# Patient Record
Sex: Male | Born: 1955 | Race: White | Hispanic: No | Marital: Married | Smoking: Never smoker
Health system: Southern US, Community
[De-identification: ages and names within clinical notes are randomized; demographics above are authoritative.]

## PROBLEM LIST (undated history)

## (undated) DIAGNOSIS — K219 Gastro-esophageal reflux disease without esophagitis: Secondary | ICD-10-CM

## (undated) DIAGNOSIS — E785 Hyperlipidemia, unspecified: Secondary | ICD-10-CM

## (undated) HISTORY — DX: Gastro-esophageal reflux disease without esophagitis: K21.9

## (undated) HISTORY — DX: Hyperlipidemia, unspecified: E78.5

## (undated) HISTORY — PX: APPENDECTOMY: SHX54

## (undated) HISTORY — PX: OTHER SURGICAL HISTORY: SHX169

---

## 2016-05-19 LAB — HM COLONOSCOPY

## 2016-05-24 DIAGNOSIS — Z8249 Family history of ischemic heart disease and other diseases of the circulatory system: Secondary | ICD-10-CM | POA: Insufficient documentation

## 2016-05-24 DIAGNOSIS — E785 Hyperlipidemia, unspecified: Secondary | ICD-10-CM | POA: Insufficient documentation

## 2018-05-21 ENCOUNTER — Ambulatory Visit: Payer: Managed Care, Other (non HMO) | Admitting: Family Medicine

## 2018-05-21 ENCOUNTER — Encounter (INDEPENDENT_AMBULATORY_CARE_PROVIDER_SITE_OTHER): Payer: Self-pay

## 2018-05-21 ENCOUNTER — Encounter: Payer: Self-pay | Admitting: Family Medicine

## 2018-05-21 VITALS — BP 100/72 | HR 70 | Temp 98.1°F | Ht 66.0 in | Wt 162.0 lb

## 2018-05-21 DIAGNOSIS — J302 Other seasonal allergic rhinitis: Secondary | ICD-10-CM

## 2018-05-21 DIAGNOSIS — G8929 Other chronic pain: Secondary | ICD-10-CM | POA: Diagnosis not present

## 2018-05-21 DIAGNOSIS — M25512 Pain in left shoulder: Secondary | ICD-10-CM

## 2018-05-21 DIAGNOSIS — K219 Gastro-esophageal reflux disease without esophagitis: Secondary | ICD-10-CM | POA: Insufficient documentation

## 2018-05-21 DIAGNOSIS — E785 Hyperlipidemia, unspecified: Secondary | ICD-10-CM | POA: Insufficient documentation

## 2018-05-21 DIAGNOSIS — Z7689 Persons encountering health services in other specified circumstances: Secondary | ICD-10-CM | POA: Diagnosis not present

## 2018-05-21 DIAGNOSIS — M25511 Pain in right shoulder: Secondary | ICD-10-CM

## 2018-05-21 DIAGNOSIS — R12 Heartburn: Secondary | ICD-10-CM | POA: Diagnosis not present

## 2018-05-21 DIAGNOSIS — E782 Mixed hyperlipidemia: Secondary | ICD-10-CM

## 2018-05-21 NOTE — Patient Instructions (Addendum)
Dyslipidemia Dyslipidemia is an imbalance of waxy, fat-like substances (lipids) in the blood. The body needs lipids in small amounts. Dyslipidemia often involves a high level of cholesterol or triglycerides, which are types of lipids. Common forms of dyslipidemia include:  High levels of bad cholesterol (LDL cholesterol). LDL is the type of cholesterol that causes fatty deposits (plaques) to build up in the blood vessels that carry blood away from your heart (arteries).  Low levels of good cholesterol (HDL cholesterol). HDL cholesterol is the type of cholesterol that protects against heart disease. High levels of HDL remove the LDL buildup from arteries.  High levels of triglycerides. Triglycerides are a fatty substance in the blood that is linked to a buildup of plaques in the arteries.  You can develop dyslipidemia because of the genes you are born with (primary dyslipidemia) or changes that occur during your life (secondary dyslipidemia), or as a side effect of certain medical treatments. What are the causes? Primary dyslipidemia is caused by changes (mutations) in genes that are passed down through families (inherited). These mutations cause several types of dyslipidemia. Mutations can result in disorders that make the body produce too much LDL cholesterol or triglycerides, or not enough HDL cholesterol. These disorders may lead to heart disease, arterial disease, or stroke at an early age. Causes of secondary dyslipidemia include certain lifestyle choices and diseases that lead to dyslipidemia, such as:  Eating a diet that is high in animal fat.  Not getting enough activity or exercise (having a sedentary lifestyle).  Having diabetes, kidney disease, liver disease, or thyroid disease.  Drinking large amounts of alcohol.  Using certain types of drugs.  What increases the risk? You may be at greater risk for dyslipidemia if you are an older man or if you are a woman who has gone through  menopause. Other risk factors include:  Having a family history of dyslipidemia.  Taking certain medicines, including birth control pills, steroids, some diuretics, beta-blockers, and some medicines forHIV.  Smoking cigarettes.  Eating a high-fat diet.  Drinking large amounts of alcohol.  Having certain medical conditions such as diabetes, polycystic ovary syndrome (PCOS), pregnancy, kidney disease, liver disease, or hypothyroidism.  Not exercising regularly.  Being overweight or obese with too much belly fat.  What are the signs or symptoms? Dyslipidemia does not usually cause any symptoms. Very high lipid levels can cause fatty bumps under the skin (xanthomas) or a white or gray ring around the black center (pupil) of the eye. Very high triglyceride levels can cause inflammation of the pancreas (pancreatitis). How is this diagnosed? Your health care provider may diagnose dyslipidemia based on a routine blood test (fasting blood test). Because most people do not have symptoms of the condition, this blood testing (lipid profile) is done on adults age 40 and older and is repeated every 5 years. This test checks:  Total cholesterol. This is a measure of the total amount of cholesterol in your blood, including LDL cholesterol, HDL cholesterol, and triglycerides. A healthy number is below 200.  LDL cholesterol. The target number for LDL cholesterol is different for each person, depending on individual risk factors. For most people, a number below 100 is healthy. Ask your health care provider what your LDL cholesterol number should be.  HDL cholesterol. An HDL level of 60 or higher is best because it helps to protect against heart disease. A number below 42 for men or below 39 for women increases the risk for heart disease.  Triglycerides. A  healthy triglyceride number is below 150.  If your lipid profile is abnormal, your health care provider may do other blood tests to get more  information about your condition. How is this treated? Treatment depends on the type of dyslipidemia that you have and your other risk factors for heart disease and stroke. Your health care provider will have a target range for your lipid levels based on this information. For many people, treatment starts with lifestyle changes, such as diet and exercise. Your health care provider may recommend that you:  Get regular exercise.  Make changes to your diet.  Quit smoking if you smoke.  If diet changes and exercise do not help you reach your goals, your health care provider may also prescribe medicine to lower lipids. The most commonly prescribed type of medicine lowers your LDL cholesterol (statin drug). If you have a high triglyceride level, your provider may prescribe another type of drug (fibrate) or an omega-3 fish oil supplement, or both. Follow these instructions at home:  Take over-the-counter and prescription medicines only as told by your health care provider. This includes supplements.  Get regular exercise. Start an aerobic exercise and strength training program as told by your health care provider. Ask your health care provider what activities are safe for you. Your health care provider may recommend: ? 30 minutes of aerobic activity 4-6 days a week. Brisk walking is an example of aerobic activity. ? Strength training 2 days a week.  Eat a healthy diet as told by your health care provider. This can help you reach and maintain a healthy weight, lower your LDL cholesterol, and raise your HDL cholesterol. It may help to work with a diet and nutrition specialist (dietitian) to make a plan that is right for you. Your dietitian or health care provider may recommend: ? Limiting your calories, if you are overweight. ? Eating more fruits, vegetables, whole grains, fish, and lean meats. ? Limiting saturated fat, trans fat, and cholesterol.  Follow instructions from your health care provider  or dietitian about eating or drinking restrictions.  Limit alcohol intake to no more than one drink per day for nonpregnant women and two drinks per day for men. One drink equals 12 oz of beer, 5 oz of wine, or 1 oz of hard liquor.  Do not use any products that contain nicotine or tobacco, such as cigarettes and e-cigarettes. If you need help quitting, ask your health care provider.  Keep all follow-up visits as told by your health care provider. This is important. Contact a health care provider if:  You are having trouble sticking to your exercise or diet plan.  You are struggling to quit smoking or control your use of alcohol. Summary  Dyslipidemia is an imbalance of waxy, fat-like substances (lipids) in the blood. The body needs lipids in small amounts. Dyslipidemia often involves a high level of cholesterol or triglycerides, which are types of lipids.  Treatment depends on the type of dyslipidemia that you have and your other risk factors for heart disease and stroke.  For many people, treatment starts with lifestyle changes, such as diet and exercise. Your health care provider may also prescribe medicine to lower lipids. This information is not intended to replace advice given to you by your health care provider. Make sure you discuss any questions you have with your health care provider. Document Released: 11/19/2013 Document Revised: 07/11/2016 Document Reviewed: 07/11/2016 Elsevier Interactive Patient Education  2018 [employer].  Shoulder Pain Many things can  cause shoulder pain, including:  An injury to the area.  Overuse of the shoulder.  Arthritis.  The source of the pain can be:  Inflammation.  An injury to the shoulder joint.  An injury to a tendon, ligament, or bone.  Follow these instructions at home: Take these actions to help with your pain:  Squeeze a soft ball or a foam pad as much as possible. This helps to keep the shoulder from swelling. It also  helps to strengthen the arm.  Take over-the-counter and prescription medicines only as told by your health care provider.  If directed, apply ice to the area: ? Put ice in a plastic bag. ? Place a towel between your skin and the bag. ? Leave the ice on for 20 minutes, 2-3 times per day. Stop applying ice if it does not help with the pain.  If you were given a shoulder sling or immobilizer: ? Wear it as told. ? Remove it to shower or bathe. ? Move your arm as little as possible, but keep your hand moving to prevent swelling.  Contact a health care provider if:  Your pain gets worse.  Your pain is not relieved with medicines.  New pain develops in your arm, hand, or fingers. Get help right away if:  Your arm, hand, or fingers: ? Tingle. ? Become numb. ? Become swollen. ? Become painful. ? Turn white or blue. This information is not intended to replace advice given to you by your health care provider. Make sure you discuss any questions you have with your health care provider. Document Released: 08/24/2005 Document Revised: 07/10/2016 Document Reviewed: 03/09/2015 Elsevier Interactive Patient Education  [employer].

## 2018-05-21 NOTE — Progress Notes (Signed)
Patient presents to clinic today to establish care.  SUBJECTIVE: PMH: Pt is a 62 yo male with pmh sig for GERD, allergies, palpitations, HLD.  Patient was previously seen in [place].  HLD: -dx'd 10 yrs ago -Trying to eat better -Currently taking simvastatin 20 mg daily -Last lipid panel September 2018.  Allergies: -spring and fall -had allergy shots in the past -using nasacort and allegra  Heartburn: -In the past had more issues and currently -Elevated head of bed -Now using Tums occasionally -Tries to avoid spicy foods and other foods that would cause symptoms  Chronic pain of both shoulders: -Pt endorses a history of sleeping on his sides.  This caused shoulder pain. -Pt had x-ray done of left shoulder which was negative. -pt requesting referral to PT.  Allergies: NKDA  Past surgical history: Removal of sinus polyps 1991 Appendectomy 1974 Tonsillectomy 1966 Cataracts removal 2013 in 2015  Social history: Patient is married.  Patient is currently retired from working in Engineer, mining as a Engineer, maintenance (IT).  Patient recently relocated [place] the [place] to be closer to his grandchildren.  Patient's son is a Software engineer for counseling.  Patient endorses social alcohol use.  Patient denies tobacco or drug use.  Family medical history: Mom-deceased at 41, HLD, HTN, stroke Dad-deceased t 74, non-Hodgkin's lymphoma, diabetes, MI, HLD, heart disease Brother-Dave, alive, diabetes, MI, heart disease, HLD Brother-Lee, alive, non-Hodgkin's lymphoma, HLD, HTN Daughter-alive, depression Son-alive MGM-deceased at 49 MGF-deceased at 20, Alzheimer's dementia PGM-Deceased, MI at 25 PGF-deceased, stroke at 79    Health Maintenance: Vision --2019 Immunizations --influenza vaccine 2018, tetanus vaccine 2006 Colonoscopy --2017.  Had a tubular adenoma removed  Past Medical History:  Diagnosis Date  . GERD (gastroesophageal reflux disease)   . Hyperlipidemia     Past  Surgical History:  Procedure Laterality Date  . APPENDECTOMY    . CATARACT    . SINUS POLLYPS    . TONSILLOCTOMY      Current Outpatient Medications on File Prior to Visit  Medication Sig Dispense Refill  . BABY ASPIRIN PO Take 160 mg by mouth daily.    . Coenzyme Q10-Fish Oil-Vit E (CO-Q 10 OMEGA-3 FISH OIL PO) Take 2,000 Units by mouth daily.    . simvastatin (ZOCOR) 20 MG tablet Take 20 mg by mouth daily.    . Triamcinolone Acetonide (NASACORT ALLERGY 24HR NA) Place into the nose as needed.     No current facility-administered medications on file prior to visit.     Allergies not on file  History reviewed. No pertinent family history.  Social History   Socioeconomic History  . Marital status: Married    Spouse name: Not on file  . Number of children: Not on file  . Years of education: Not on file  . Highest education level: Not on file  Occupational History  . Not on file  Social Needs  . Financial resource strain: Not on file  . Food insecurity:    Worry: Not on file    Inability: Not on file  . Transportation needs:    Medical: Not on file    Non-medical: Not on file  Tobacco Use  . Smoking status: Never Smoker  . Smokeless tobacco: Never Used  Substance and Sexual Activity  . Alcohol use: Yes    Comment: OCCASIONAL  . Drug use: Never  . Sexual activity: Not on file  Lifestyle  . Physical activity:    Days per week: Not on file  Minutes per session: Not on file  . Stress: Not on file  Relationships  . Social connections:    Talks on phone: Not on file    Gets together: Not on file    Attends religious service: Not on file    Active member of club or organization: Not on file    Attends meetings of clubs or organizations: Not on file    Relationship status: Not on file  . Intimate partner violence:    Fear of current or ex partner: Not on file    Emotionally abused: Not on file    Physically abused: Not on file    Forced sexual activity: Not on  file  Other Topics Concern  . Not on file  Social History Narrative  . Not on file    ROS General: Denies fever, chills, night sweats, changes in weight, changes in appetite HEENT: Denies headaches, ear pain, changes in vision, rhinorrhea, sore throat CV: Denies CP, palpitations, SOB, orthopnea Pulm: Denies SOB, cough, wheezing GI: Denies abdominal pain, nausea, vomiting, diarrhea, constipation GU: Denies dysuria, hematuria, frequency, vaginal discharge Msk: Denies muscle cramps  +b/l shoulder pain Neuro: Denies weakness, numbness, tingling Skin: Denies rashes, bruising Psych: Denies depression, anxiety, hallucinations  BP 100/72 (BP Location: Left Arm, Patient Position: Sitting, Cuff Size: Normal)   Pulse 70   Temp 98.1 F (36.7 C) (Oral)   Ht 5\' 6"  (1.676 m)   Wt 162 lb (73.5 kg)   SpO2 97%   BMI 26.15 kg/m   Physical Exam Gen. Pleasant, well developed, well-nourished, in NAD HEENT - [place]/AT, PERRL, no scleral icterus, no nasal drainage, pharynx without erythema or exudate. Lungs: no use of accessory muscles, CTAB, no wheezes, rales or rhonchi Cardiovascular: RRR, No r/g/m, no peripheral edema Abdomen: BS present, soft, nontender, nondistended Musculoskeletal: No deformities, moves all four extremities, no cyanosis or clubbing, normal tone. FROM with pain when raising arms above head. Neuro:  A&Ox3, CN II-XII intact, normal gait Skin:  Warm, dry, intact, no lesions   No results found for this or any previous visit (from the past 2160 hour(s)).  Assessment/Plan: Mixed hyperlipidemia -Continue simvastatin 20 mg daily -Continue to make changes to diet including decrease the amount of saturated fats and increase physical activity  Seasonal allergies  - Plan: Ambulatory referral to Allergy  Heart burn -continue to  avoid foods known to cause symptoms -Continue Tums as needed  Encounter to establish care -We reviewed the PMH, PSH, FH, SH, Meds and Allergies. -We  provided refills for any medications we will prescribe as needed. -We addressed current concerns per orders and patient instructions. -We have asked for records for pertinent exams, studies, vaccines and notes from previous providers. -We have advised patient to follow up per instructions below.  Chronic pain of both shoulders  - Plan: Ambulatory referral to Physical Therapy  F/u in Sept for CPE, sooner if needed.  Grier Mitts, MD

## 2018-05-24 ENCOUNTER — Ambulatory Visit: Payer: Managed Care, Other (non HMO) | Attending: Family Medicine | Admitting: Physical Therapy

## 2018-05-24 ENCOUNTER — Other Ambulatory Visit: Payer: Self-pay

## 2018-05-24 DIAGNOSIS — M25612 Stiffness of left shoulder, not elsewhere classified: Secondary | ICD-10-CM

## 2018-05-24 DIAGNOSIS — M6281 Muscle weakness (generalized): Secondary | ICD-10-CM | POA: Insufficient documentation

## 2018-05-24 DIAGNOSIS — M25511 Pain in right shoulder: Secondary | ICD-10-CM | POA: Diagnosis present

## 2018-05-24 DIAGNOSIS — M25512 Pain in left shoulder: Secondary | ICD-10-CM | POA: Insufficient documentation

## 2018-05-24 DIAGNOSIS — M25611 Stiffness of right shoulder, not elsewhere classified: Secondary | ICD-10-CM

## 2018-05-24 DIAGNOSIS — G8929 Other chronic pain: Secondary | ICD-10-CM | POA: Diagnosis present

## 2018-05-24 NOTE — Patient Instructions (Signed)
Access Code: [email or web address]  URL: [email or web address]  Date: 05/24/2018  Prepared by: Lovett Calender   Exercises  Doorway Pec Stretch at 90 Degrees Abduction - 2 reps - 1 sets - 30 sec hold - 3x daily - 7x weekly  Standing Scapular Retraction - 10 reps - 1 sets - 5 sec hold - 3x daily - 7x weekly

## 2018-05-24 NOTE — Therapy (Signed)
[facility] [address], [place], [place], [address] Phone: [phone]   Fax:  [phone]  Physical Therapy Evaluation  Patient Details  Name: Franklin [name] MRN: [id number] Date of Birth: Mar 29, 1956 Referring Provider: Billie Ruddy, MD   Encounter Date: 05/24/2018  PT End of Session - 05/24/18 1007    Visit Number  1    Date for PT Re-Evaluation  07/05/18    PT Start Time  0930    PT Stop Time  1007    PT Time Calculation (min)  37 min    Activity Tolerance  Patient tolerated treatment well    Behavior During Therapy  [facility] for tasks assessed/performed       Past Medical History:  Diagnosis Date  . GERD (gastroesophageal reflux disease)   . Hyperlipidemia     Past Surgical History:  Procedure Laterality Date  . APPENDECTOMY    . CATARACT    . SINUS POLLYPS    . TONSILLOCTOMY      There were no vitals filed for this visit.   Subjective Assessment - 05/24/18 0930    Subjective  Pt presents to clinic due to shoulder pain with overhead reaching.  Sleeping on his side increases pain.  Pt played a lot of golf and tennis.  Began about a year ago    Diagnostic tests  x-ray    Patient Stated Goals  playing golf and tennis    Currently in Pain?  Yes    Pain Score  5     Pain Location  Shoulder    Pain Orientation  Right;Left    Pain Descriptors / Indicators  Sharp    Pain Type  Chronic pain    Pain Onset  More than a month ago    Pain Frequency  Intermittent    Aggravating Factors   overhead reaching and side sleeping, some pain with golf    Pain Relieving Factors  getting into a comfortable position    Effect of Pain on Daily Activities  lifting grandson         [facility] PT Assessment - 05/24/18 0001      Assessment   Medical Diagnosis  M25.511,G89.29,M25.512 (ICD-10-CM) - Chronic pain of both shoulders    Referring Provider  Billie Ruddy, MD    Onset Date/Surgical Date  -- last summer    Hand Dominance  Right    Prior Therapy  No      Precautions   Precautions  None      Restrictions   Weight Bearing Restrictions  No      Balance Screen   Has the patient fallen in the past 6 months  No      [place] residence    Living Arrangements  Spouse/significant other      Prior Function   Level of [place]  Retired    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    Leisure  golf and tennis      Cognition   Overall Cognitive Status  Within Functional Limits for tasks assessed      Observation/Other Assessments   Focus on Therapeutic Outcomes (FOTO)   32% limited      Posture/Postural Control   Posture/Postural Control  Postural limitations    Postural Limitations  Rounded Shoulders;Forward head;Increased thoracic kyphosis      ROM / Strength   AROM / PROM /  Strength  AROM;Strength      AROM   AROM Assessment Site  Shoulder    Right/Left Shoulder  Right;Left    Right Shoulder Flexion  142 Degrees    Right Shoulder ABduction  155 Degrees    Left Shoulder Flexion  156 Degrees    Left Shoulder ABduction  134 Degrees      Strength   Strength Assessment Site  Shoulder    Right/Left Shoulder  Right;Left    Right Shoulder Flexion  4+/5    Right Shoulder Extension  5/5    Right Shoulder ABduction  4+/5    Right Shoulder Internal Rotation  5/5    Right Shoulder External Rotation  4/5    Right Shoulder Horizontal ABduction  5/5    Right Shoulder Horizontal ADduction  5/5    Left Shoulder Flexion  4/5    Left Shoulder Extension  5/5    Left Shoulder ABduction  4/5    Left Shoulder Internal Rotation  5/5    Left Shoulder External Rotation  4/5    Left Shoulder Horizontal ABduction  4/5    Left Shoulder Horizontal ADduction  4+/5      Palpation   Palpation comment  bilateral humerus in anterior position within glenohumeral joint and hypomobility, pec major/minor, anterior deltoid tight  and tender; tender anterior head of humerus      Special Tests    Special Tests  Rotator Cuff Impingement    Rotator Cuff Impingment tests  Neer impingement test;Hawkins- Kennedy test;Empty Can test      Neer Impingement test    Findings  Positive    Side  Right      Hawkins-Kennedy test   Findings  Negative      Empty Can test   Findings  Positive    Side  Right      Ambulation/Gait   Gait Pattern  Within Functional Limits                Objective measurements completed on examination: See above findings.              PT Education - 05/24/18 1044    Education Details   Access Code: [email or web address]           PT Long Term Goals - 05/24/18 1119      PT LONG TERM GOAL #1   Title  pt will demonstrate shoulder flexion of at least 160 degrees bilaterally for improved functional overhead reaching    Time  6    Period  Weeks    Status  New    Target Date  07/05/18      PT LONG TERM GOAL #2   Title  pt able to reach to back seat of the car without pain    Time  6    Period  Weeks    Status  New    Target Date  07/05/18      PT LONG TERM GOAL #3   Title  ind with advanced HEP    Time  6    Period  Weeks    Status  New    Target Date  07/05/18      PT LONG TERM GOAL #4   Title  FOTO < or = to 24% limited    Time  6    Period  Weeks    Status  New    Target Date  07/05/18      PT LONG TERM  GOAL #5   Title  pt will demonstrate 5/5 MMT external rotation of bilateral shoulder for improved functional lifting and reaching    Time  6    Period  Weeks    Status  New    Target Date  07/05/18             Plan - 05/24/18 1044    Clinical Impression Statement  Pt presents to skilled PT due to shoulder pain that has been getting worse over the last year.  Pain began in left shoulder and now in both mostly with overhead and reaching back.  Pt has positive Neer and empty can on the left side.  Pt demonstrates postural deficits.  He has bilateral  shoulder weakness. Pt has hypomobilty and anteriorly positioned humeral head bilaterally.  Pt has tight and tender pecs and anterior deltoid.  There is some tenderness of RTC attatchments with palpation. Pt will benefit from skilled PT to address impairments and return to full functional activities.    Clinical Presentation  Evolving    Clinical Presentation due to:  pt has had increased pain recently    Clinical Decision Making  Low    Rehab Potential  Excellent    PT Frequency  2x / week    PT Duration  6 weeks    PT Treatment/Interventions  ADLs/Self Care Home Management;Passive range of motion;Neuromuscular re-education;Biofeedback;Cryotherapy;Electrical Stimulation;Iontophoresis 4mg /ml Dexamethasone;Moist Heat;Ultrasound;Therapeutic activities;Therapeutic exercise;Patient/family education;Manual techniques;Taping;Dry needling    PT Next Visit Plan  A/P joint mobs and STM to pecs and deltoid, shoulder ROM, postural and scapular strength and stability    PT Home Exercise Plan   Access Code: [email or web address]     Recommended Other Services  eval 05/24/18    Consulted and Agree with Plan of Care  Patient       Patient will benefit from skilled therapeutic intervention in order to improve the following deficits and impairments:  Pain, Hypomobility, Decreased strength, Decreased range of motion, Impaired UE functional use  Visit Diagnosis: Chronic left shoulder pain - Plan: PT plan of care cert/re-cert  Chronic right shoulder pain - Plan: PT plan of care cert/re-cert  Muscle weakness (generalized) - Plan: PT plan of care cert/re-cert  Stiffness of left shoulder, not elsewhere classified - Plan: PT plan of care cert/re-cert  Stiffness of right shoulder, not elsewhere classified - Plan: PT plan of care cert/re-cert     Problem List Patient Active Problem List   Diagnosis Date Noted  . Chronic pain of both shoulders 05/21/2018  . Heart burn 05/21/2018  . Seasonal allergies 05/21/2018  . Mixed  hyperlipidemia 05/21/2018    Zannie Cove, PT 05/24/2018, 6:02 PM  [facility] [address] [place], [place], [address] Phone: [phone]   Fax:  [phone]  Name: Franklin [name] MRN: [id number] Date of Birth: 1956-11-24

## 2018-05-29 ENCOUNTER — Encounter: Payer: Self-pay | Admitting: Physical Therapy

## 2018-05-29 ENCOUNTER — Ambulatory Visit: Payer: Managed Care, Other (non HMO) | Attending: Family Medicine | Admitting: Physical Therapy

## 2018-05-29 DIAGNOSIS — G8929 Other chronic pain: Secondary | ICD-10-CM | POA: Insufficient documentation

## 2018-05-29 DIAGNOSIS — M6281 Muscle weakness (generalized): Secondary | ICD-10-CM | POA: Insufficient documentation

## 2018-05-29 DIAGNOSIS — M25511 Pain in right shoulder: Secondary | ICD-10-CM | POA: Insufficient documentation

## 2018-05-29 DIAGNOSIS — M25512 Pain in left shoulder: Secondary | ICD-10-CM | POA: Diagnosis present

## 2018-05-29 DIAGNOSIS — M25612 Stiffness of left shoulder, not elsewhere classified: Secondary | ICD-10-CM | POA: Diagnosis present

## 2018-05-29 DIAGNOSIS — M25611 Stiffness of right shoulder, not elsewhere classified: Secondary | ICD-10-CM | POA: Insufficient documentation

## 2018-05-29 NOTE — Therapy (Signed)
[facility] [address] [place], [place], [address] Phone: [phone]   Fax:  [phone]  Physical Therapy Treatment  Patient Details  Name: Franklin [name] MRN: [id number] Date of Birth: 1956-11-06 Referring Provider: Billie Ruddy, MD   Encounter Date: 05/29/2018  PT End of Session - 05/29/18 0800    Visit Number  2    Date for PT Re-Evaluation  07/05/18    PT Start Time  0800    PT Stop Time  0840    PT Time Calculation (min)  40 min    Activity Tolerance  Patient tolerated treatment well    Behavior During Therapy  [facility] for tasks assessed/performed       Past Medical History:  Diagnosis Date  . GERD (gastroesophageal reflux disease)   . Hyperlipidemia     Past Surgical History:  Procedure Laterality Date  . APPENDECTOMY    . CATARACT    . SINUS POLLYPS    . TONSILLOCTOMY      There were no vitals filed for this visit.  Subjective Assessment - 05/29/18 0800    Subjective  Pt denies pain this morning.  Reports it is improving overall.  Still having pain when reaching back.    Currently in Pain?  No/denies                       [facility] Adult PT Treatment/Exercise - 05/29/18 0001      Exercises   Exercises  Shoulder      Shoulder Exercises: Standing   Horizontal ABduction  Strengthening;Both;Theraband 30 reps    Theraband Level (Shoulder Horizontal ABduction)  Level 1 (Yellow)    External Rotation  Strengthening;Both;20 reps;Theraband    Theraband Level (Shoulder External Rotation)  Level 2 (Red)    Internal Rotation  Strengthening;Both;20 reps;Theraband    Theraband Level (Shoulder Internal Rotation)  Level 2 (Red)    Extension  Strengthening;Both;20 reps;Theraband    Theraband Level (Shoulder Extension)  Level 2 (Red)    Row  Strengthening;Both;20 reps;Theraband    Theraband Level (Shoulder Row)  Level 2 (Red);Level 3 (Green)      Shoulder Exercises: Pulleys   Flexion  3  minutes PT present to discuss progress      Shoulder Exercises: ROM/Strengthening   Other ROM/Strengthening Exercises  flexion with ball on the wall                  PT Long Term Goals - 05/24/18 1119      PT LONG TERM GOAL #1   Title  pt will demonstrate shoulder flexion of at least 160 degrees bilaterally for improved functional overhead reaching    Time  6    Period  Weeks    Status  New    Target Date  07/05/18      PT LONG TERM GOAL #2   Title  pt able to reach to back seat of the car without pain    Time  6    Period  Weeks    Status  New    Target Date  07/05/18      PT LONG TERM GOAL #3   Title  ind with advanced HEP    Time  6    Period  Weeks    Status  New    Target Date  07/05/18      PT LONG TERM GOAL #4   Title  FOTO < or =  to 24% limited    Time  6    Period  Weeks    Status  New    Target Date  07/05/18      PT LONG TERM GOAL #5   Title  pt will demonstrate 5/5 MMT external rotation of bilateral shoulder for improved functional lifting and reaching    Time  6    Period  Weeks    Status  New    Target Date  07/05/18            Plan - 05/29/18 0803    Clinical Impression Statement  Pt reports already feeling better with initial HEP.  Pt did well with exercises and needed cues for posture.  He had less pain when doing shoulder ER when he was cued to stabilize the scapula.  Pt will continue with POC    PT Treatment/Interventions  ADLs/Self Care Home Management;Passive range of motion;Neuromuscular re-education;Biofeedback;Cryotherapy;Electrical Stimulation;Iontophoresis 4mg /ml Dexamethasone;Moist Heat;Ultrasound;Therapeutic activities;Therapeutic exercise;Patient/family education;Manual techniques;Taping;Dry needling    PT Next Visit Plan  A/P joint mobs and STM to pecs and deltoid, shoulder ROM, postural and scapular strength and stability    PT Home Exercise Plan   Access Code: [email or web address]     Consulted and Agree with Plan of Care  Patient        Patient will benefit from skilled therapeutic intervention in order to improve the following deficits and impairments:  Pain, Hypomobility, Decreased strength, Decreased range of motion, Impaired UE functional use  Visit Diagnosis: Chronic left shoulder pain  Chronic right shoulder pain  Muscle weakness (generalized)  Stiffness of left shoulder, not elsewhere classified  Stiffness of right shoulder, not elsewhere classified     Problem List Patient Active Problem List   Diagnosis Date Noted  . Chronic pain of both shoulders 05/21/2018  . Heart burn 05/21/2018  . Seasonal allergies 05/21/2018  . Mixed hyperlipidemia 05/21/2018    Zannie Cove, PT 05/29/2018, 8:41 AM  [facility] [address], [place], [place], [address] Phone: [phone]   Fax:  [phone]  Name: Franklin [name] MRN: [id number] Date of Birth: Apr 08, 1956

## 2018-06-04 ENCOUNTER — Encounter: Payer: Self-pay | Admitting: Physical Therapy

## 2018-06-04 ENCOUNTER — Ambulatory Visit: Payer: Managed Care, Other (non HMO) | Admitting: Physical Therapy

## 2018-06-04 DIAGNOSIS — M25612 Stiffness of left shoulder, not elsewhere classified: Secondary | ICD-10-CM

## 2018-06-04 DIAGNOSIS — M25511 Pain in right shoulder: Secondary | ICD-10-CM

## 2018-06-04 DIAGNOSIS — M25512 Pain in left shoulder: Secondary | ICD-10-CM | POA: Diagnosis not present

## 2018-06-04 DIAGNOSIS — M6281 Muscle weakness (generalized): Secondary | ICD-10-CM

## 2018-06-04 DIAGNOSIS — M25611 Stiffness of right shoulder, not elsewhere classified: Secondary | ICD-10-CM

## 2018-06-04 DIAGNOSIS — G8929 Other chronic pain: Secondary | ICD-10-CM

## 2018-06-04 NOTE — Patient Instructions (Signed)
Access Code: [email or web address]  URL: [email or web address]  Date: 06/04/2018  Prepared by: Lovett Calender   Exercises  Doorway Pec Stretch at 90 Degrees Abduction - 2 reps - 1 sets - 30 sec hold - 3x daily - 7x weekly  Standing Scapular Retraction - 10 reps - 1 sets - 5 sec hold - 3x daily - 7x weekly  Standing Shoulder Row with Anchored Resistance - 10 reps - 3 sets - 1x daily - 7x weekly  Shoulder Extension with Resistance - Neutral - 10 reps - 3 sets - 1x daily - 7x weekly  Shoulder External Rotation with Anchored Resistance - 10 reps - 3 sets - 1x daily - 7x weekly  Seated Cervical Sidebending Stretch - 10 reps - 3 sets - 1x daily - 7x weekly  Standing Shoulder W at [place] 10 reps - 1 sets - 5 sec hold - 2x daily - 7x weekly   [facility] [address], [place], [place] [address] Phone # [phone] Fax [phone]

## 2018-06-04 NOTE — Therapy (Signed)
[facility] [address], [place], [place], [address] Phone: [phone]   Fax:  [phone]  Physical Therapy Treatment  Patient Details  Name: Franklin [name] MRN: [id number] Date of Birth: 10/16/1956 Referring Provider: Billie Ruddy, MD   Encounter Date: 06/04/2018  PT End of Session - 06/04/18 0846    Visit Number  3    Date for PT Re-Evaluation  07/05/18    PT Start Time  0843    PT Stop Time  0925    PT Time Calculation (min)  42 min    Activity Tolerance  Patient tolerated treatment well    Behavior During Therapy  [facility] for tasks assessed/performed       Past Medical History:  Diagnosis Date  . GERD (gastroesophageal reflux disease)   . Hyperlipidemia     Past Surgical History:  Procedure Laterality Date  . APPENDECTOMY    . CATARACT    . SINUS POLLYPS    . TONSILLOCTOMY      There were no vitals filed for this visit.  Subjective Assessment - 06/04/18 0847    Subjective  Pt reprots he painted some shelves in his garage and did that for about 6 hours.  He reports the neck stretch was very helpful.    Patient Stated Goals  playing golf and tennis    Currently in Pain?  No/denies         [facility] PT Assessment - 06/04/18 0001      AROM   Right Shoulder Flexion  152 Degrees    Right Shoulder ABduction  165 Degrees    Left Shoulder Flexion  164 Degrees    Left Shoulder ABduction  150 Degrees                   [facility] Adult PT Treatment/Exercise - 06/04/18 0001      Shoulder Exercises: Prone   Other Prone Exercises  prone on green pball - T and Y - 2 x 10 each      Shoulder Exercises: Standing   External Rotation  Strengthening;Both;20 reps;Theraband    Theraband Level (Shoulder External Rotation)  Level 3 (Green)    Flexion  Strengthening;Right;20 reps;Theraband    Theraband Level (Shoulder Flexion)  Level 2 (Red)    Other Standing Exercises  standing with foam roll behind -  W's 5 sec hold x 10      Shoulder Exercises: ROM/Strengthening   UBE (Upper Arm Bike)  L2 3 x 3 PT present to get status update       rows blue band and extension green band - 20x each Wall push up 20x      PT Education - 06/04/18 0925    Education Details   Access Code: [email or web address]     Person(s) Educated  Patient    Methods  Explanation;Demonstration;Handout;Verbal cues;Tactile cues    Comprehension  Verbalized understanding;Returned demonstration          PT Long Term Goals - 06/04/18 0926      PT LONG TERM GOAL #1   Title  pt will demonstrate shoulder flexion of at least 160 degrees bilaterally for improved functional overhead reaching    Time  6    Period  Weeks    Status  On-going            Plan - 06/04/18 0906    Clinical Impression Statement  Pt is doing well and progressing with increased resistance and increased ROM  as noted above.  He still needs some cues due to increased activation of upper traps.  Pt will benefit from skilled PT to continue postural strength during funcitonal movements.      PT Treatment/Interventions  ADLs/Self Care Home Management;Passive range of motion;Neuromuscular re-education;Biofeedback;Cryotherapy;Electrical Stimulation;Iontophoresis 4mg /ml Dexamethasone;Moist Heat;Ultrasound;Therapeutic activities;Therapeutic exercise;Patient/family education;Manual techniques;Taping;Dry needling    PT Next Visit Plan  postural and scapular strength and stability, A/P joint mobs and STM to pecs and deltoid as needed    PT Home Exercise Plan   Access Code: [email or web address]     Consulted and Agree with Plan of Care  Patient       Patient will benefit from skilled therapeutic intervention in order to improve the following deficits and impairments:  Pain, Hypomobility, Decreased strength, Decreased range of motion, Impaired UE functional use  Visit Diagnosis: Chronic left shoulder pain  Chronic right shoulder pain  Muscle weakness  (generalized)  Stiffness of left shoulder, not elsewhere classified  Stiffness of right shoulder, not elsewhere classified     Problem List Patient Active Problem List   Diagnosis Date Noted  . Chronic pain of both shoulders 05/21/2018  . Heart burn 05/21/2018  . Seasonal allergies 05/21/2018  . Mixed hyperlipidemia 05/21/2018    Zannie Cove 06/04/2018, 9:27 AM  [facility] [address], [place], [place], [address] Phone: [phone]   Fax:  [phone]  Name: Franklin [name] MRN: [id number] Date of Birth: 1956-03-14

## 2018-06-18 ENCOUNTER — Ambulatory Visit: Payer: Managed Care, Other (non HMO) | Admitting: Physical Therapy

## 2018-06-18 ENCOUNTER — Encounter: Payer: Self-pay | Admitting: Physical Therapy

## 2018-06-18 DIAGNOSIS — G8929 Other chronic pain: Secondary | ICD-10-CM

## 2018-06-18 DIAGNOSIS — M25512 Pain in left shoulder: Principal | ICD-10-CM

## 2018-06-18 DIAGNOSIS — M6281 Muscle weakness (generalized): Secondary | ICD-10-CM

## 2018-06-18 DIAGNOSIS — M25611 Stiffness of right shoulder, not elsewhere classified: Secondary | ICD-10-CM

## 2018-06-18 DIAGNOSIS — M25612 Stiffness of left shoulder, not elsewhere classified: Secondary | ICD-10-CM

## 2018-06-18 DIAGNOSIS — M25511 Pain in right shoulder: Secondary | ICD-10-CM

## 2018-06-18 NOTE — Therapy (Signed)
[facility] [address] [place], [place], [address] Phone: [phone]   Fax:  [phone]  Physical Therapy Treatment  Patient Details  Name: Franklin [name] MRN: [id number] Date of Birth: 08-Jul-1956 Referring Provider: Billie Ruddy, MD   Encounter Date: 06/18/2018  PT End of Session - 06/18/18 0842    Visit Number  4    Date for PT Re-Evaluation  07/05/18    PT Start Time  0845    PT Stop Time  0925    PT Time Calculation (min)  40 min    Activity Tolerance  Patient tolerated treatment well    Behavior During Therapy  [facility] for tasks assessed/performed       Past Medical History:  Diagnosis Date  . GERD (gastroesophageal reflux disease)   . Hyperlipidemia     Past Surgical History:  Procedure Laterality Date  . APPENDECTOMY    . CATARACT    . SINUS POLLYPS    . TONSILLOCTOMY      There were no vitals filed for this visit.  Subjective Assessment - 06/18/18 0846    Subjective  The trip was good.  I have been doing the exercises and feel things getting stronger.  I was using the blue for most of the exercises.  I still have soreness doing the bands to the side    Patient Stated Goals  playing golf and tennis    Currently in Pain?  No/denies                       [facility] Adult PT Treatment/Exercise - 06/18/18 0001      Shoulder Exercises: Prone   Other Prone Exercises  prone on green pball - T and Y - 2 x 10 each      Shoulder Exercises: Standing   Horizontal ABduction  Strengthening;Both;Theraband 30 reps    Theraband Level (Shoulder Horizontal ABduction)  Level 2 (Red)    External Rotation  Strengthening;Both;20 reps;Theraband with 90 deg abduction    Theraband Level (Shoulder External Rotation)  Level 1 (Yellow)    Retraction Limitations  power tower for D1 below - 20 lb    Diagonals  Strengthening;Right;Left;15 reps;Theraband;Weights;Limitations D1 weight/ D2 band    Theraband  Level (Shoulder Diagonals)  Level 1 (Yellow)      Shoulder Exercises: ROM/Strengthening   UBE (Upper Arm Bike)  L3 -  3 x 40mn      Manual Therapy   Manual therapy comments  STM to bilateral pecs and delt; AP mobs grade II-III             PT Education - 06/18/18 0928    Education Details   Access Code: [email or web address]    Person(s) Educated  Patient    Methods  Explanation;Demonstration;Handout    Comprehension  Verbalized understanding;Returned demonstration          PT Long Term Goals - 06/18/18 0849      PT LONG TERM GOAL #1   Title  pt will demonstrate shoulder flexion of at least 160 degrees bilaterally for improved functional overhead reaching    Time  6    Period  Weeks    Status  Achieved      PT LONG TERM GOAL #2   Title  pt able to reach to back seat of the car without pain    Baseline  sometimes uncomfortable but a lot better    Time  6  Period  Weeks    Status  Partially Met      PT LONG TERM GOAL #3   Title  ind with advanced HEP    Time  6    Period  Weeks    Status  On-going      PT LONG TERM GOAL #5   Title  pt will demonstrate 5/5 MMT external rotation of bilateral shoulder for improved functional lifting and reaching    Time  6    Period  Weeks    Status  On-going            Plan - 06/18/18 3491    Clinical Impression Statement  Pt is doing well with progression of exercises.  He achieved goal for ROM.  He continues to have some tightness in pec major/minor that got moderate release with STM.  Cont with POC.    PT Treatment/Interventions  ADLs/Self Care Home Management;Passive range of motion;Neuromuscular re-education;Biofeedback;Cryotherapy;Electrical Stimulation;Iontophoresis 36m/ml Dexamethasone;Moist Heat;Ultrasound;Therapeutic activities;Therapeutic exercise;Patient/family education;Manual techniques;Taping;Dry needling    PT Next Visit Plan  postural and scapular strength and stability, A/P joint mobs and STM to pecs and deltoid as  needed    PT Home Exercise Plan   Access Code: [email or web address]    Recommended Other Services  eval 6/27 order signed    Consulted and Agree with Plan of Care  Patient       Patient will benefit from skilled therapeutic intervention in order to improve the following deficits and impairments:  Pain, Hypomobility, Decreased strength, Decreased range of motion, Impaired UE functional use  Visit Diagnosis: Chronic left shoulder pain  Chronic right shoulder pain  Muscle weakness (generalized)  Stiffness of left shoulder, not elsewhere classified  Stiffness of right shoulder, not elsewhere classified     Problem List Patient Active Problem List   Diagnosis Date Noted  . Chronic pain of both shoulders 05/21/2018  . Heart burn 05/21/2018  . Seasonal allergies 05/21/2018  . Mixed hyperlipidemia 05/21/2018    JZannie Cove PT 06/18/2018, 9:30 AM  [facility] [address] [place] [address]: [phone]  Fax:  [phone] Name: Franklin PinedoMRN: [id number] of Birth: 103-19-1957

## 2018-06-18 NOTE — Patient Instructions (Signed)
Access Code: [email or web address]  URL: [email or web address]  Date: 06/18/2018  Prepared by: Lovett Calender   Exercises  Doorway Pec Stretch at 90 Degrees Abduction - 2 reps - 1 sets - 30 sec hold - 3x daily - 7x weekly  Standing Scapular Retraction - 10 reps - 1 sets - 5 sec hold - 3x daily - 7x weekly  Standing Shoulder Row with Anchored Resistance - 10 reps - 3 sets - 1x daily - 7x weekly  Shoulder Extension with Resistance - Neutral - 10 reps - 3 sets - 1x daily - 7x weekly  Shoulder External Rotation with Anchored Resistance - 10 reps - 3 sets - 1x daily - 7x weekly  Seated Cervical Sidebending Stretch - 10 reps - 3 sets - 1x daily - 7x weekly  Standing Shoulder Horizontal Abduction with Resistance - 10 reps - 3 sets - 1x daily - 7x weekly  Single Leg Stance and Shoulder External Rotation with Anchored Resistance - 10 reps - 3 sets - 1x daily - 7x weekly   [facility] [address], [place], [place] [address] Phone # [phone] Fax [phone]

## 2018-06-21 ENCOUNTER — Ambulatory Visit: Payer: Managed Care, Other (non HMO) | Admitting: Physical Therapy

## 2018-06-21 ENCOUNTER — Encounter: Payer: Self-pay | Admitting: Physical Therapy

## 2018-06-21 DIAGNOSIS — G8929 Other chronic pain: Secondary | ICD-10-CM

## 2018-06-21 DIAGNOSIS — M6281 Muscle weakness (generalized): Secondary | ICD-10-CM

## 2018-06-21 DIAGNOSIS — M25512 Pain in left shoulder: Principal | ICD-10-CM

## 2018-06-21 DIAGNOSIS — M25612 Stiffness of left shoulder, not elsewhere classified: Secondary | ICD-10-CM

## 2018-06-21 DIAGNOSIS — M25511 Pain in right shoulder: Secondary | ICD-10-CM

## 2018-06-21 DIAGNOSIS — M25611 Stiffness of right shoulder, not elsewhere classified: Secondary | ICD-10-CM

## 2018-06-21 NOTE — Patient Instructions (Signed)
Access Code: [email or web address]  URL: [email or web address]  Date: 06/21/2018  Prepared by: Lovett Calender   Exercises  Doorway Pec Stretch at 90 Degrees Abduction - 2 reps - 1 sets - 30 sec hold - 3x daily - 7x weekly  Seated Cervical Sidebending Stretch - 10 reps - 3 sets - 1x daily - 7x weekly  Standing Scapular Retraction - 10 reps - 1 sets - 5 sec hold - 3x daily - 7x weekly  Standing Shoulder Row with Anchored Resistance - 10 reps - 3 sets - 1x daily - 7x weekly  Shoulder Extension with Resistance - Neutral - 10 reps - 3 sets - 1x daily - 7x weekly  Shoulder External Rotation with Anchored Resistance - 10 reps - 3 sets - 1x daily - 7x weekly  Standing Shoulder Horizontal Abduction with Resistance - 10 reps - 3 sets - 1x daily - 7x weekly  Single Leg Stance and Shoulder External Rotation with Anchored Resistance - 10 reps - 3 sets - 1x daily - 7x weekly  Supine Scapular Protraction in Flexion with Dumbbells - 10 reps - 3 sets - 1x daily - 7x weekly  Kneeling Plank with Scapular Protraction Retraction AROM - 10 reps - 3 sets - 1x daily - 7x weekly  Modified Push Up on Knees - 10 reps - 3 sets - 1x daily - 7x weekly   [facility] [place] [facility] [address], [place], [place] [address] Phone # [phone] Fax [phone]

## 2018-06-21 NOTE — Therapy (Signed)
[facility] [address], [place], [place], [address] Phone: [phone]   Fax:  [phone]  Physical Therapy Treatment  Patient Details  Name: Franklin [name] MRN: [id number] Date of Birth: Apr 11, 1956 Referring Provider: Billie Ruddy, MD   Encounter Date: 06/21/2018  PT End of Session - 06/21/18 0851    Visit Number  5    Date for PT Re-Evaluation  07/05/18    PT Start Time  0846    PT Stop Time  0926    PT Time Calculation (min)  40 min    Activity Tolerance  Patient tolerated treatment well    Behavior During Therapy  [facility] for tasks assessed/performed       Past Medical History:  Diagnosis Date  . GERD (gastroesophageal reflux disease)   . Hyperlipidemia     Past Surgical History:  Procedure Laterality Date  . APPENDECTOMY    . CATARACT    . SINUS POLLYPS    . TONSILLOCTOMY      There were no vitals filed for this visit.  Subjective Assessment - 06/21/18 0931    Subjective  Pt was able to play golf.  He reports doing well with HEP    Currently in Pain?  No/denies         [facility] PT Assessment - 06/21/18 0001      Strength   Right Shoulder Flexion  5/5    Right Shoulder Extension  5/5    Right Shoulder ABduction  5/5    Right Shoulder Internal Rotation  5/5    Right Shoulder External Rotation  5/5    Right Shoulder Horizontal ABduction  5/5    Right Shoulder Horizontal ADduction  5/5    Left Shoulder Flexion  5/5    Left Shoulder Extension  5/5    Left Shoulder ABduction  5/5    Left Shoulder Internal Rotation  5/5    Left Shoulder External Rotation  5/5    Left Shoulder Horizontal ABduction  5/5    Left Shoulder Horizontal ADduction  5/5                   [facility] Adult PT Treatment/Exercise - 06/21/18 0001      Shoulder Exercises: Supine   Other Supine Exercises  serratus punches5 lb - 20x      Shoulder Exercises: Prone   Other Prone Exercises  prone on green pball - T  and Y - 2 x 10 each    Other Prone Exercises  protraction in q-ped - 20x; push up on knees cues to stabilize scapula      Shoulder Exercises: Standing   External Rotation  Strengthening;Both;20 reps;Theraband with 90 deg abduction    Theraband Level (Shoulder External Rotation)  Level 2 (Red)    Other Standing Exercises  row at power tower 30lb - 20; ext 25 lb 20x      Shoulder Exercises: ROM/Strengthening   UBE (Upper Arm Bike)  L4 -  3 x 3mn PT present for status             PT Education - 06/21/18 0947    Education Details   Access Code: [email or web address]    Person(s) Educated  Patient    Methods  Explanation;Demonstration;Handout;Verbal cues;Tactile cues    Comprehension  Verbalized understanding;Returned demonstration          PT Long Term Goals - 06/21/18 0902      PT LONG TERM GOAL #1  Title  pt will demonstrate shoulder flexion of at least 160 degrees bilaterally for improved functional overhead reaching    Status  Achieved      PT LONG TERM GOAL #2   Title  pt able to reach to back seat of the car without pain    Baseline  no pain just a little stiff    Status  Achieved      PT LONG TERM GOAL #3   Title  ind with advanced HEP    Status  Achieved      PT LONG TERM GOAL #4   Title  FOTO < or = to 24% limited    Baseline  8%    Status  Achieved      PT LONG TERM GOAL #5   Title  pt will demonstrate 5/5 MMT external rotation of bilateral shoulder for improved functional lifting and reaching    Status  Achieved            Plan - 06/21/18 0910    Clinical Impression Statement  Pt is doing well and was able to golf without issues.  He will benefit from being discharged with HEP today    PT Treatment/Interventions  ADLs/Self Care Home Management;Passive range of motion;Neuromuscular re-education;Biofeedback;Cryotherapy;Electrical Stimulation;Iontophoresis '4mg'$ /ml Dexamethasone;Moist Heat;Ultrasound;Therapeutic activities;Therapeutic exercise;Patient/family  education;Manual techniques;Taping;Dry needling    PT Next Visit Plan  discharged today    PT Home Exercise Plan   Access Code: [email or web address]     Consulted and Agree with Plan of Care  Patient       Patient will benefit from skilled therapeutic intervention in order to improve the following deficits and impairments:     Visit Diagnosis: Chronic left shoulder pain  Chronic right shoulder pain  Muscle weakness (generalized)  Stiffness of left shoulder, not elsewhere classified  Stiffness of right shoulder, not elsewhere classified     Problem List Patient Active Problem List   Diagnosis Date Noted  . Chronic pain of both shoulders 05/21/2018  . Heart burn 05/21/2018  . Seasonal allergies 05/21/2018  . Mixed hyperlipidemia 05/21/2018    Zannie Cove, PT 06/21/2018, 9:51 AM  [facility] [address], [place], [place], [address] Phone: [phone]   Fax:  [phone]  Name: Franklin [name] MRN: [id number] Date of Birth: December 13, 1955  PHYSICAL THERAPY DISCHARGE SUMMARY  Visits from Start of Care: 5  Current functional level related to goals / functional outcomes: See above related goals   Remaining deficits: See above   Education / Equipment: See HEP  Plan: Patient agrees to discharge.  Patient goals were met. Patient is being discharged due to meeting the stated rehab goals.  ?????     [employer], PT 06/21/18 9:51 AM

## 2018-06-25 ENCOUNTER — Encounter: Payer: Managed Care, Other (non HMO) | Admitting: Physical Therapy

## 2018-06-28 ENCOUNTER — Encounter: Payer: Managed Care, Other (non HMO) | Admitting: Physical Therapy

## 2018-08-27 ENCOUNTER — Encounter: Payer: Self-pay | Admitting: Family Medicine

## 2018-08-29 ENCOUNTER — Encounter: Payer: Self-pay | Admitting: Family Medicine

## 2018-08-29 ENCOUNTER — Ambulatory Visit (INDEPENDENT_AMBULATORY_CARE_PROVIDER_SITE_OTHER): Payer: Managed Care, Other (non HMO) | Admitting: Family Medicine

## 2018-08-29 VITALS — BP 110/72 | HR 62 | Temp 98.2°F | Wt 164.0 lb

## 2018-08-29 DIAGNOSIS — Z125 Encounter for screening for malignant neoplasm of prostate: Secondary | ICD-10-CM | POA: Diagnosis not present

## 2018-08-29 DIAGNOSIS — Z0001 Encounter for general adult medical examination with abnormal findings: Secondary | ICD-10-CM | POA: Diagnosis not present

## 2018-08-29 DIAGNOSIS — Z131 Encounter for screening for diabetes mellitus: Secondary | ICD-10-CM | POA: Diagnosis not present

## 2018-08-29 DIAGNOSIS — E782 Mixed hyperlipidemia: Secondary | ICD-10-CM | POA: Diagnosis not present

## 2018-08-29 DIAGNOSIS — K429 Umbilical hernia without obstruction or gangrene: Secondary | ICD-10-CM | POA: Diagnosis not present

## 2018-08-29 DIAGNOSIS — Z23 Encounter for immunization: Secondary | ICD-10-CM

## 2018-08-29 DIAGNOSIS — Z0000 Encounter for general adult medical examination without abnormal findings: Secondary | ICD-10-CM

## 2018-08-29 LAB — CBC WITH DIFFERENTIAL/PLATELET
BASOS PCT: 0.6 % (ref 0.0–3.0)
Basophils Absolute: 0 10*3/uL (ref 0.0–0.1)
EOS PCT: 3.3 % (ref 0.0–5.0)
Eosinophils Absolute: 0.2 10*3/uL (ref 0.0–0.7)
HCT: 44.2 % (ref 39.0–52.0)
Hemoglobin: 15.5 g/dL (ref 13.0–17.0)
LYMPHS ABS: 1.6 10*3/uL (ref 0.7–4.0)
Lymphocytes Relative: 26.1 % (ref 12.0–46.0)
MCHC: 35 g/dL (ref 30.0–36.0)
MCV: 94.2 fl (ref 78.0–100.0)
MONOS PCT: 7.7 % (ref 3.0–12.0)
Monocytes Absolute: 0.5 10*3/uL (ref 0.1–1.0)
NEUTROS ABS: 3.8 10*3/uL (ref 1.4–7.7)
NEUTROS PCT: 62.3 % (ref 43.0–77.0)
PLATELETS: 188 10*3/uL (ref 150.0–400.0)
RBC: 4.69 Mil/uL (ref 4.22–5.81)
RDW: 12.9 % (ref 11.5–15.5)
WBC: 6.1 10*3/uL (ref 4.0–10.5)

## 2018-08-29 LAB — LIPID PANEL
CHOLESTEROL: 138 mg/dL (ref 0–200)
HDL: 43.8 mg/dL (ref >39.00)
LDL CALC: 65 mg/dL (ref 0–99)
NonHDL: 94.56
Total CHOL/HDL Ratio: 3
Triglycerides: 148 mg/dL (ref 0.0–149.0)
VLDL: 29.6 mg/dL (ref 0.0–40.0)

## 2018-08-29 LAB — BASIC METABOLIC PANEL
BUN: 13 mg/dL (ref 6–23)
CALCIUM: 9.4 mg/dL (ref 8.4–10.5)
CO2: 28 meq/L (ref 19–32)
Chloride: 103 mEq/L (ref 96–112)
Creatinine, Ser: 0.84 mg/dL (ref 0.40–1.50)
GFR: 98.16 mL/min (ref >60.00)
Glucose, Bld: 93 mg/dL (ref 70–99)
Potassium: 4.5 mEq/L (ref 3.5–5.1)
Sodium: 139 mEq/L (ref 135–145)

## 2018-08-29 LAB — HEMOGLOBIN A1C: Hgb A1c MFr Bld: 5.7 % (ref 4.6–6.5)

## 2018-08-29 LAB — PSA: PSA: 1.42 ng/mL (ref 0.10–4.00)

## 2018-08-29 NOTE — Patient Instructions (Addendum)
Preventive Care 40-64 Years, Male Preventive care refers to lifestyle choices and visits with your health care provider that can promote health and wellness. What does preventive care include?  A yearly physical exam. This is also called an annual well check.  Dental exams once or twice a year.  Routine eye exams. Ask your health care provider how often you should have your eyes checked.  Personal lifestyle choices, including: ? Daily care of your teeth and gums. ? Regular physical activity. ? Eating a healthy diet. ? Avoiding tobacco and drug use. ? Limiting alcohol use. ? Practicing safe sex. ? Taking low-dose aspirin every day starting at age 39. What happens during an annual well check? The services and screenings done by your health care provider during your annual well check will depend on your age, overall health, lifestyle risk factors, and family history of disease. Counseling Your health care provider may ask you questions about your:  Alcohol use.  Tobacco use.  Drug use.  Emotional well-being.  Home and relationship well-being.  Sexual activity.  Eating habits.  Work and work Statistician.  Screening You may have the following tests or measurements:  Height, weight, and BMI.  Blood pressure.  Lipid and cholesterol levels. These may be checked every 5 years, or more frequently if you are over 76 years old.  Skin check.  Lung cancer screening. You may have this screening every year starting at age 19 if you have a 30-pack-year history of smoking and currently smoke or have quit within the past 15 years.  Fecal occult blood test (FOBT) of the stool. You may have this test every year starting at age 26.  Flexible sigmoidoscopy or colonoscopy. You may have a sigmoidoscopy every 5 years or a colonoscopy every 10 years starting at age 17.  Prostate cancer screening. Recommendations will vary depending on your family history and other risks.  Hepatitis C  blood test.  Hepatitis B blood test.  Sexually transmitted disease (STD) testing.  Diabetes screening. This is done by checking your blood sugar (glucose) after you have not eaten for a while (fasting). You may have this done every 1-3 years.  Discuss your test results, treatment options, and if necessary, the need for more tests with your health care provider. Vaccines Your health care provider may recommend certain vaccines, such as:  Influenza vaccine. This is recommended every year.  Tetanus, diphtheria, and acellular pertussis (Tdap, Td) vaccine. You may need a Td booster every 10 years.  Varicella vaccine. You may need this if you have not been vaccinated.  Zoster vaccine. You may need this after age 79.  Measles, mumps, and rubella (MMR) vaccine. You may need at least one dose of MMR if you were born in 1957 or later. You may also need a second dose.  Pneumococcal 13-valent conjugate (PCV13) vaccine. You may need this if you have certain conditions and have not been vaccinated.  Pneumococcal polysaccharide (PPSV23) vaccine. You may need one or two doses if you smoke cigarettes or if you have certain conditions.  Meningococcal vaccine. You may need this if you have certain conditions.  Hepatitis A vaccine. You may need this if you have certain conditions or if you travel or work in places where you may be exposed to hepatitis A.  Hepatitis B vaccine. You may need this if you have certain conditions or if you travel or work in places where you may be exposed to hepatitis B.  Haemophilus influenzae type b (Hib) vaccine.  You may need this if you have certain risk factors.  Talk to your health care provider about which screenings and vaccines you need and how often you need them. This information is not intended to replace advice given to you by your health care provider. Make sure you discuss any questions you have with your health care provider. Document Released: 12/11/2015  Document Revised: 08/03/2016 Document Reviewed: 09/15/2015 Elsevier Interactive Patient Education  2018 [place].  Umbilical Hernia, Adult A hernia is a bulge of tissue that pushes through an opening between muscles. An umbilical hernia happens in the abdomen, near the belly button (umbilicus). The hernia may contain tissues from the small intestine, large intestine, or fatty tissue covering the intestines (omentum). Umbilical hernias in adults tend to get worse over time, and they require surgical treatment. There are several types of umbilical hernias. You may have:  A hernia located just above or below the umbilicus (indirect hernia). This is the most common type of umbilical hernia in adults.  A hernia that forms through an opening formed by the umbilicus (direct hernia).  A hernia that comes and goes (reducible hernia). A reducible hernia may be visible only when you strain, lift something heavy, or cough. This type of hernia can be pushed back into the abdomen (reduced).  A hernia that traps abdominal tissue inside the hernia (incarcerated hernia). This type of hernia cannot be reduced.  A hernia that cuts off blood flow to the tissues inside the hernia (strangulated hernia). The tissues can start to die if this happens. This type of hernia requires emergency treatment.  What are the causes? An umbilical hernia happens when tissue inside the abdomen presses on a weak area of the abdominal muscles. What increases the risk? You may have a greater risk of this condition if you:  Are obese.  Have had several pregnancies.  Have a buildup of fluid inside your abdomen (ascites).  Have had surgery that weakens the abdominal muscles.  What are the signs or symptoms? The main symptom of this condition is a painless bulge at or near the belly button. A reducible hernia may be visible only when you strain, lift something heavy, or cough. Other symptoms may include:  Dull pain.  A  feeling of pressure.  Symptoms of a strangulated hernia may include:  Pain that gets increasingly worse.  Nausea and vomiting.  Pain when pressing on the hernia.  Skin over the hernia becoming red or purple.  Constipation.  Blood in the stool.  How is this diagnosed? This condition may be diagnosed based on:  A physical exam. You may be asked to cough or strain while standing. These actions increase the pressure inside your abdomen and force the hernia through the opening in your muscles. Your health care provider may try to reduce the hernia by pressing on it.  Your symptoms and medical history.  How is this treated? Surgery is the only treatment for an umbilical hernia. Surgery for a strangulated hernia is done as soon as possible. If you have a small hernia that is not incarcerated, you may need to lose weight before having surgery. Follow these instructions at home:  Lose weight, if told by your health care provider.  Do not try to push the hernia back in.  Watch your hernia for any changes in color or size. Tell your health care provider if any changes occur.  You may need to avoid activities that increase pressure on your hernia.  Do not  lift anything that is heavier than 10 lb (4.5 kg) until your health care provider says that this is safe.  Take over-the-counter and prescription medicines only as told by your health care provider.  Keep all follow-up visits as told by your health care provider. This is important. Contact a health care provider if:  Your hernia gets larger.  Your hernia becomes painful. Get help right away if:  You develop sudden, severe pain near the area of your hernia.  You have pain as well as nausea or vomiting.  You have pain and the skin over your hernia changes color.  You develop a fever. This information is not intended to replace advice given to you by your health care provider. Make sure you discuss any questions you have with  your health care provider. Document Released: 04/15/2016 Document Revised: 07/17/2016 Document Reviewed: 04/15/2016 Elsevier Interactive Patient Education  2018 [place].  Umbilical Hernia, Adult A hernia is a bulge of tissue that pushes through an opening between muscles. An umbilical hernia happens in the abdomen, near the belly button (umbilicus). The hernia may contain tissues from the small intestine, large intestine, or fatty tissue covering the intestines (omentum). Umbilical hernias in adults tend to get worse over time, and they require surgical treatment. There are several types of umbilical hernias. You may have:  A hernia located just above or below the umbilicus (indirect hernia). This is the most common type of umbilical hernia in adults.  A hernia that forms through an opening formed by the umbilicus (direct hernia).  A hernia that comes and goes (reducible hernia). A reducible hernia may be visible only when you strain, lift something heavy, or cough. This type of hernia can be pushed back into the abdomen (reduced).  A hernia that traps abdominal tissue inside the hernia (incarcerated hernia). This type of hernia cannot be reduced.  A hernia that cuts off blood flow to the tissues inside the hernia (strangulated hernia). The tissues can start to die if this happens. This type of hernia requires emergency treatment.  What are the causes? An umbilical hernia happens when tissue inside the abdomen presses on a weak area of the abdominal muscles. What increases the risk? You may have a greater risk of this condition if you:  Are obese.  Have had several pregnancies.  Have a buildup of fluid inside your abdomen (ascites).  Have had surgery that weakens the abdominal muscles.  What are the signs or symptoms? The main symptom of this condition is a painless bulge at or near the belly button. A reducible hernia may be visible only when you strain, lift something heavy,  or cough. Other symptoms may include:  Dull pain.  A feeling of pressure.  Symptoms of a strangulated hernia may include:  Pain that gets increasingly worse.  Nausea and vomiting.  Pain when pressing on the hernia.  Skin over the hernia becoming red or purple.  Constipation.  Blood in the stool.  How is this diagnosed? This condition may be diagnosed based on:  A physical exam. You may be asked to cough or strain while standing. These actions increase the pressure inside your abdomen and force the hernia through the opening in your muscles. Your health care provider may try to reduce the hernia by pressing on it.  Your symptoms and medical history.  How is this treated? Surgery is the only treatment for an umbilical hernia. Surgery for a strangulated hernia is done as soon as possible. If  you have a small hernia that is not incarcerated, you may need to lose weight before having surgery. Follow these instructions at home:  Lose weight, if told by your health care provider.  Do not try to push the hernia back in.  Watch your hernia for any changes in color or size. Tell your health care provider if any changes occur.  You may need to avoid activities that increase pressure on your hernia.  Do not lift anything that is heavier than 10 lb (4.5 kg) until your health care provider says that this is safe.  Take over-the-counter and prescription medicines only as told by your health care provider.  Keep all follow-up visits as told by your health care provider. This is important. Contact a health care provider if:  Your hernia gets larger.  Your hernia becomes painful. Get help right away if:  You develop sudden, severe pain near the area of your hernia.  You have pain as well as nausea or vomiting.  You have pain and the skin over your hernia changes color.  You develop a fever. This information is not intended to replace advice given to you by your health care  provider. Make sure you discuss any questions you have with your health care provider. Document Released: 04/15/2016 Document Revised: 07/17/2016 Document Reviewed: 04/15/2016 Elsevier Interactive Patient Education  [employer].

## 2018-08-29 NOTE — Progress Notes (Signed)
Subjective:     Franklin [name] is a 62 y.o. male and is here for a comprehensive physical exam. The patient reports problems - Bellybutton protrusion x a few months. Pt notes his navel has a bulge that is mildly uncomfortable to push back in.  No pain when left alone.  Social History   Socioeconomic History  . Marital status: Married    Spouse name: Not on file  . Number of children: Not on file  . Years of education: Not on file  . Highest education level: Not on file  Occupational History  . Not on file  Social Needs  . Financial resource strain: Not on file  . Food insecurity:    Worry: Not on file    Inability: Not on file  . Transportation needs:    Medical: Not on file    Non-medical: Not on file  Tobacco Use  . Smoking status: Never Smoker  . Smokeless tobacco: Never Used  Substance and Sexual Activity  . Alcohol use: Yes    Comment: OCCASIONAL  . Drug use: Never  . Sexual activity: Not on file  Lifestyle  . Physical activity:    Days per week: Not on file    Minutes per session: Not on file  . Stress: Not on file  Relationships  . Social connections:    Talks on phone: Not on file    Gets together: Not on file    Attends religious service: Not on file    Active member of club or organization: Not on file    Attends meetings of clubs or organizations: Not on file    Relationship status: Not on file  . Intimate partner violence:    Fear of current or ex partner: Not on file    Emotionally abused: Not on file    Physically abused: Not on file    Forced sexual activity: Not on file  Other Topics Concern  . Not on file  Social History Narrative  . Not on file   Health Maintenance  Topic Date Due  . Hepatitis C Screening  Apr 22, 1956  . HIV Screening  11/11/1970  . TETANUS/TDAP  11/11/1974  . COLONOSCOPY  11/11/2005  . INFLUENZA VACCINE  06/28/2018    The following portions of the patient's history were reviewed and updated as appropriate: allergies,  current medications, past family history, past medical history, past social history, past surgical history and problem list.  Review of Systems A comprehensive review of systems was negative.   Objective:    BP 110/72 (BP Location: Left Arm, Patient Position: Sitting, Cuff Size: Normal)   Pulse 62   Temp 98.2 F (36.8 C) (Oral)   Wt 164 lb (74.4 kg)   SpO2 98%   BMI 26.47 kg/m  General appearance: alert, cooperative and no distress Head: Normocephalic, without obvious abnormality, atraumatic Eyes: conjunctivae/corneas clear. PERRL, EOM's intact. Fundi benign. Ears: normal TM's and external ear canals both ears Nose: Nares normal. Septum midline. Mucosa normal. No drainage or sinus tenderness. Throat: lips, mucosa, and tongue normal; teeth and gums normal Neck: no adenopathy, no carotid bruit, no JVD, supple, symmetrical, trachea midline and thyroid not enlarged, symmetric, no tenderness/mass/nodules Lungs: clear to auscultation bilaterally Heart: regular rate and rhythm, S1, S2 normal, no murmur, click, rub or gallop Abdomen: abnormal findings:  umbilical hernia Extremities: extremities normal, atraumatic, no cyanosis or edema Skin: Skin color, texture, turgor normal. No rashes or lesions Neurologic: Alert and oriented X 3, normal strength and  tone. Normal symmetric reflexes. Normal coordination and gait    Assessment:    Healthy male exam with small umbilical hernia.     Plan:     Anticipatory guidance given including wearing seatbelts, smoke detectors in the home, increasing physical activity, increasing p.o. intake of water and vegetables. -will obtain labs -Colonoscopy up-to-date, 2017 -Given handouts -pt to consider shingles and Tdap vaccine.  May obtain at pharmacy. -Next CPE in 1 year See After Visit Summary for Counseling Recommendations   Umbilical hernia without obstruction and without gangrene -Referral to general surgery placed -Patient given RTC or ED  precautions including increased pain, skin discoloration, nausea, vomiting, fever, chills, etc.  Prostate cancer screening -PSA  Screen for diabetes -Family history of DM -We will obtain hemoglobin A1c -Lifestyle modifications encouraged  Need for influenza vaccine -Influenza vaccine given this visit  HLD -We will obtain lipid panel  F/u prn  Grier Mitts, MD

## 2018-09-25 ENCOUNTER — Ambulatory Visit: Payer: Self-pay | Admitting: General Surgery

## 2018-11-01 ENCOUNTER — Other Ambulatory Visit: Payer: Self-pay

## 2018-11-01 ENCOUNTER — Encounter (HOSPITAL_BASED_OUTPATIENT_CLINIC_OR_DEPARTMENT_OTHER): Payer: Self-pay | Admitting: *Deleted

## 2018-11-05 ENCOUNTER — Other Ambulatory Visit: Payer: Self-pay | Admitting: General Surgery

## 2018-11-05 ENCOUNTER — Ambulatory Visit
Admission: RE | Admit: 2018-11-05 | Discharge: 2018-11-05 | Disposition: A | Discharge status: Home / Self Care | Payer: Managed Care, Other (non HMO) | Source: Ambulatory Visit | Attending: General Surgery | Admitting: General Surgery

## 2018-11-05 ENCOUNTER — Encounter: Payer: Self-pay | Admitting: Family Medicine

## 2018-11-05 ENCOUNTER — Encounter (HOSPITAL_BASED_OUTPATIENT_CLINIC_OR_DEPARTMENT_OTHER)
Admission: RE | Admit: 2018-11-05 | Discharge: 2018-11-05 | Disposition: A | Discharge status: Home / Self Care | Payer: Managed Care, Other (non HMO) | Source: Ambulatory Visit | Attending: General Surgery | Admitting: General Surgery

## 2018-11-05 ENCOUNTER — Other Ambulatory Visit: Payer: Self-pay

## 2018-11-05 DIAGNOSIS — Z01811 Encounter for preprocedural respiratory examination: Secondary | ICD-10-CM

## 2018-11-05 DIAGNOSIS — Z01818 Encounter for other preprocedural examination: Secondary | ICD-10-CM | POA: Diagnosis present

## 2018-11-05 LAB — CBC WITH DIFFERENTIAL/PLATELET
ABS IMMATURE GRANULOCYTES: 0.03 10*3/uL (ref 0.00–0.07)
Basophils Absolute: 0.1 10*3/uL (ref 0.0–0.1)
Basophils Relative: 1 %
Eosinophils Absolute: 0.2 10*3/uL (ref 0.0–0.5)
Eosinophils Relative: 3 %
HCT: 43.5 % (ref 39.0–52.0)
Hemoglobin: 15.1 g/dL (ref 13.0–17.0)
Immature Granulocytes: 0 %
Lymphocytes Relative: 27 %
Lymphs Abs: 1.9 10*3/uL (ref 0.7–4.0)
MCH: 32.8 pg (ref 26.0–34.0)
MCHC: 34.7 g/dL (ref 30.0–36.0)
MCV: 94.6 fL (ref 80.0–100.0)
MONOS PCT: 6 %
Monocytes Absolute: 0.4 10*3/uL (ref 0.1–1.0)
Neutro Abs: 4.3 10*3/uL (ref 1.7–7.7)
Neutrophils Relative %: 63 %
Platelets: 181 10*3/uL (ref 150–400)
RBC: 4.6 MIL/uL (ref 4.22–5.81)
RDW: 11.7 % (ref 11.5–15.5)
WBC: 6.8 10*3/uL (ref 4.0–10.5)
nRBC: 0 % (ref 0.0–0.2)

## 2018-11-05 LAB — BASIC METABOLIC PANEL
Anion gap: 9 (ref 5–15)
BUN: 15 mg/dL (ref 8–23)
CO2: 27 mmol/L (ref 22–32)
Calcium: 9.3 mg/dL (ref 8.9–10.3)
Chloride: 104 mmol/L (ref 98–111)
Creatinine, Ser: 0.97 mg/dL (ref 0.61–1.24)
GFR calc Af Amer: >60 mL/min (ref >60)
GFR calc non Af Amer: >60 mL/min (ref >60)
GLUCOSE: 106 mg/dL — AB (ref 70–99)
Potassium: 3.8 mmol/L (ref 3.5–5.1)
Sodium: 140 mmol/L (ref 135–145)

## 2018-11-05 NOTE — Progress Notes (Signed)
Ensure pre surgery drink given with instructions to complete by [place], instructed pt to obtain chest xray from [place], pt verbalized understanding.

## 2018-11-06 NOTE — Telephone Encounter (Signed)
Copied from [place] [id number]. Topic: Quick Communication - Rx Refill/Question >> Nov 06, 2018 10:04 AM Ahmed Prima L wrote: Medication: simvastatin (ZOCOR) 20 MG tablet ( 90 day supply )  Has the patient contacted their pharmacy? No since he is changing to [facility]. Mentioned this to Dr Volanda Napoleon at visit in October (Agent: If no, request that the patient contact the pharmacy for the refill.) (Agent: If yes, when and what did the pharmacy advise?)  Preferred Pharmacy (with phone number or street name): [address], [place] [address]    Agent: Please be advised that RX refills may take up to 3 business days. We ask that you follow-up with your pharmacy.

## 2018-11-07 ENCOUNTER — Other Ambulatory Visit: Payer: Self-pay

## 2018-11-07 MED ORDER — SIMVASTATIN 20 MG PO TABS: 20.0000 mg | ORAL_TABLET | Freq: Every day | ORAL | 2 refills | Status: DC

## 2018-11-09 ENCOUNTER — Encounter (HOSPITAL_BASED_OUTPATIENT_CLINIC_OR_DEPARTMENT_OTHER): Payer: Self-pay | Admitting: Certified Registered"

## 2018-11-09 ENCOUNTER — Ambulatory Visit (HOSPITAL_BASED_OUTPATIENT_CLINIC_OR_DEPARTMENT_OTHER)
Admission: RE | Admit: 2018-11-09 | Discharge: 2018-11-09 | Disposition: A | Discharge status: Home / Self Care | Payer: Managed Care, Other (non HMO) | Attending: General Surgery | Admitting: General Surgery

## 2018-11-09 ENCOUNTER — Ambulatory Visit (HOSPITAL_BASED_OUTPATIENT_CLINIC_OR_DEPARTMENT_OTHER): Payer: Managed Care, Other (non HMO) | Admitting: Certified Registered"

## 2018-11-09 ENCOUNTER — Ambulatory Visit (HOSPITAL_COMMUNITY): Payer: Managed Care, Other (non HMO)

## 2018-11-09 ENCOUNTER — Encounter (HOSPITAL_BASED_OUTPATIENT_CLINIC_OR_DEPARTMENT_OTHER)
Admission: RE | Disposition: A | Discharge status: Home / Self Care | Payer: Self-pay | Source: Home / Self Care | Attending: General Surgery

## 2018-11-09 ENCOUNTER — Other Ambulatory Visit: Payer: Self-pay

## 2018-11-09 DIAGNOSIS — K429 Umbilical hernia without obstruction or gangrene: Secondary | ICD-10-CM | POA: Diagnosis present

## 2018-11-09 DIAGNOSIS — Z01818 Encounter for other preprocedural examination: Secondary | ICD-10-CM

## 2018-11-09 DIAGNOSIS — Z7982 Long term (current) use of aspirin: Secondary | ICD-10-CM | POA: Insufficient documentation

## 2018-11-09 DIAGNOSIS — E7800 Pure hypercholesterolemia, unspecified: Secondary | ICD-10-CM | POA: Diagnosis not present

## 2018-11-09 DIAGNOSIS — K420 Umbilical hernia with obstruction, without gangrene: Secondary | ICD-10-CM | POA: Diagnosis not present

## 2018-11-09 HISTORY — PX: UMBILICAL HERNIA REPAIR: SHX196

## 2018-11-09 SURGERY — REPAIR, HERNIA, UMBILICAL, ADULT
Anesthesia: General | Site: Abdomen

## 2018-11-09 MED ORDER — SODIUM BICARBONATE 4 % IV SOLN
INTRAVENOUS | Status: AC
  Filled 2018-11-09: qty 5

## 2018-11-09 MED ORDER — SUGAMMADEX SODIUM 200 MG/2ML IV SOLN
INTRAVENOUS | Status: DC | PRN
  Administered 2018-11-09: 200 mg via INTRAVENOUS

## 2018-11-09 MED ORDER — SUCCINYLCHOLINE CHLORIDE 20 MG/ML IJ SOLN
INTRAMUSCULAR | Status: DC | PRN
  Administered 2018-11-09: 120 mg via INTRAVENOUS

## 2018-11-09 MED ORDER — BUPIVACAINE LIPOSOME 1.3 % IJ SUSP
INTRAMUSCULAR | Status: AC
  Filled 2018-11-09: qty 20

## 2018-11-09 MED ORDER — ROCURONIUM BROMIDE 50 MG/5ML IV SOSY
PREFILLED_SYRINGE | INTRAVENOUS | Status: AC
  Filled 2018-11-09: qty 5

## 2018-11-09 MED ORDER — SCOPOLAMINE 1 MG/3DAYS TD PT72: 1.0000 | MEDICATED_PATCH | Freq: Once | TRANSDERMAL | Status: DC | PRN

## 2018-11-09 MED ORDER — OXYCODONE HCL 5 MG PO TABS: 2.5000 mg | ORAL_TABLET | Freq: Four times a day (QID) | ORAL | 0 refills | Status: DC | PRN

## 2018-11-09 MED ORDER — SUGAMMADEX SODIUM 200 MG/2ML IV SOLN
INTRAVENOUS | Status: AC
  Filled 2018-11-09: qty 2

## 2018-11-09 MED ORDER — LIDOCAINE 2% (20 MG/ML) 5 ML SYRINGE
INTRAMUSCULAR | Status: AC
  Filled 2018-11-09: qty 5

## 2018-11-09 MED ORDER — DEXAMETHASONE SODIUM PHOSPHATE 10 MG/ML IJ SOLN
INTRAMUSCULAR | Status: AC
  Filled 2018-11-09: qty 1

## 2018-11-09 MED ORDER — ONDANSETRON HCL 4 MG/2ML IJ SOLN
INTRAMUSCULAR | Status: AC
  Filled 2018-11-09: qty 2

## 2018-11-09 MED ORDER — MIDAZOLAM HCL 2 MG/2ML IJ SOLN
INTRAMUSCULAR | Status: AC
  Filled 2018-11-09: qty 2

## 2018-11-09 MED ORDER — DEXAMETHASONE SODIUM PHOSPHATE 4 MG/ML IJ SOLN
INTRAMUSCULAR | Status: DC | PRN
  Administered 2018-11-09: 10 mg via INTRAVENOUS

## 2018-11-09 MED ORDER — LACTATED RINGERS IV SOLN
INTRAVENOUS | Status: DC
  Administered 2018-11-09 (×2): via INTRAVENOUS

## 2018-11-09 MED ORDER — GABAPENTIN 300 MG PO CAPS
300.0000 mg | ORAL_CAPSULE | ORAL | Status: AC
  Administered 2018-11-09: 300 mg via ORAL

## 2018-11-09 MED ORDER — LIDOCAINE HCL (PF) 1 % IJ SOLN
INTRAMUSCULAR | Status: AC
  Filled 2018-11-09: qty 30

## 2018-11-09 MED ORDER — LIDOCAINE HCL URETHRAL/MUCOSAL 2 % EX GEL
CUTANEOUS | Status: AC
  Filled 2018-11-09: qty 5

## 2018-11-09 MED ORDER — CHLORHEXIDINE GLUCONATE CLOTH 2 % EX PADS: 6.0000 | MEDICATED_PAD | Freq: Once | CUTANEOUS | Status: DC

## 2018-11-09 MED ORDER — CEFAZOLIN SODIUM-DEXTROSE 2-4 GM/100ML-% IV SOLN
INTRAVENOUS | Status: AC
  Filled 2018-11-09: qty 100

## 2018-11-09 MED ORDER — BUPIVACAINE LIPOSOME 1.3 % IJ SUSP: 20.0000 mL | Freq: Once | INTRAMUSCULAR | Status: DC

## 2018-11-09 MED ORDER — CEFAZOLIN SODIUM-DEXTROSE 2-4 GM/100ML-% IV SOLN
2.0000 g | INTRAVENOUS | Status: AC
  Administered 2018-11-09: 2 g via INTRAVENOUS

## 2018-11-09 MED ORDER — PROPOFOL 10 MG/ML IV BOLUS
INTRAVENOUS | Status: DC | PRN
  Administered 2018-11-09: 150 mg via INTRAVENOUS

## 2018-11-09 MED ORDER — PROPOFOL 10 MG/ML IV BOLUS
INTRAVENOUS | Status: AC
  Filled 2018-11-09: qty 20

## 2018-11-09 MED ORDER — ACETAMINOPHEN 500 MG PO TABS
1000.0000 mg | ORAL_TABLET | ORAL | Status: AC
  Administered 2018-11-09: 1000 mg via ORAL

## 2018-11-09 MED ORDER — SODIUM CHLORIDE 0.9 % IV SOLN
INTRAVENOUS | Status: AC
  Filled 2018-11-09: qty 500000

## 2018-11-09 MED ORDER — LIDOCAINE HCL (CARDIAC) PF 100 MG/5ML IV SOSY
PREFILLED_SYRINGE | INTRAVENOUS | Status: DC | PRN
  Administered 2018-11-09: 80 mg via INTRAVENOUS

## 2018-11-09 MED ORDER — ACETAMINOPHEN 500 MG PO TABS
ORAL_TABLET | ORAL | Status: AC
  Filled 2018-11-09: qty 2

## 2018-11-09 MED ORDER — FENTANYL CITRATE (PF) 100 MCG/2ML IJ SOLN
INTRAMUSCULAR | Status: AC
  Filled 2018-11-09: qty 2

## 2018-11-09 MED ORDER — ONDANSETRON HCL 4 MG/2ML IJ SOLN
INTRAMUSCULAR | Status: DC | PRN
  Administered 2018-11-09: 4 mg via INTRAVENOUS

## 2018-11-09 MED ORDER — BUPIVACAINE HCL (PF) 0.5 % IJ SOLN
INTRAMUSCULAR | Status: AC
  Filled 2018-11-09: qty 30

## 2018-11-09 MED ORDER — FENTANYL CITRATE (PF) 100 MCG/2ML IJ SOLN
50.0000 ug | INTRAMUSCULAR | Status: DC | PRN
  Administered 2018-11-09: 100 ug via INTRAVENOUS

## 2018-11-09 MED ORDER — GABAPENTIN 300 MG PO CAPS
ORAL_CAPSULE | ORAL | Status: AC
  Filled 2018-11-09: qty 1

## 2018-11-09 MED ORDER — ROCURONIUM BROMIDE 100 MG/10ML IV SOLN
INTRAVENOUS | Status: DC | PRN
  Administered 2018-11-09: 40 mg via INTRAVENOUS

## 2018-11-09 MED ORDER — BUPIVACAINE LIPOSOME 1.3 % IJ SUSP
INTRAMUSCULAR | Status: DC | PRN
  Administered 2018-11-09: 20 mL

## 2018-11-09 MED ORDER — MIDAZOLAM HCL 2 MG/2ML IJ SOLN
1.0000 mg | INTRAMUSCULAR | Status: DC | PRN
  Administered 2018-11-09: 2 mg via INTRAVENOUS

## 2018-11-09 SURGICAL SUPPLY — 62 items
BAG DECANTER FOR FLEXI CONT (MISCELLANEOUS) ×2 IMPLANT
BINDER ABDOMINAL 12 SM 30-45 (SOFTGOODS) ×2 IMPLANT
BLADE CLIPPER SURG (BLADE) ×2 IMPLANT
BLADE SURG 10 STRL SS (BLADE) ×2 IMPLANT
BLADE SURG 15 STRL LF DISP TIS (BLADE) ×1 IMPLANT
BLADE SURG 15 STRL SS (BLADE) ×1
CANISTER SUCT 1200ML W/VALVE (MISCELLANEOUS) IMPLANT
CHLORAPREP W/TINT 26ML (MISCELLANEOUS) ×2 IMPLANT
CLEANER CAUTERY TIP 5X5 PAD (MISCELLANEOUS) ×1 IMPLANT
CONT SPEC 4OZ CLIKSEAL STRL BL (MISCELLANEOUS) IMPLANT
COVER BACK TABLE 60X90IN (DRAPES) ×2 IMPLANT
COVER MAYO STAND STRL (DRAPES) ×2 IMPLANT
COVER WAND RF STERILE (DRAPES) IMPLANT
DECANTER SPIKE VIAL GLASS SM (MISCELLANEOUS) ×2 IMPLANT
DERMABOND ADVANCED (GAUZE/BANDAGES/DRESSINGS) ×1
DERMABOND ADVANCED .7 DNX12 (GAUZE/BANDAGES/DRESSINGS) ×1 IMPLANT
DRAPE LAPAROTOMY TRNSV 102X78 (DRAPE) ×2 IMPLANT
DRAPE UTILITY XL STRL (DRAPES) ×2 IMPLANT
DRSG TEGADERM 2-3/8X2-3/4 SM (GAUZE/BANDAGES/DRESSINGS) IMPLANT
DRSG TEGADERM 4X4.75 (GAUZE/BANDAGES/DRESSINGS) ×2 IMPLANT
ELECT REM PT RETURN 9FT ADLT (ELECTROSURGICAL) ×2
ELECTRODE REM PT RTRN 9FT ADLT (ELECTROSURGICAL) ×1 IMPLANT
GLOVE BIOGEL PI IND STRL 7.0 (GLOVE) ×1 IMPLANT
GLOVE BIOGEL PI IND STRL 8 (GLOVE) ×1 IMPLANT
GLOVE BIOGEL PI INDICATOR 7.0 (GLOVE) ×1
GLOVE BIOGEL PI INDICATOR 8 (GLOVE) ×1
GLOVE ECLIPSE 6.5 STRL STRAW (GLOVE) ×2 IMPLANT
GLOVE ECLIPSE 7.5 STRL STRAW (GLOVE) ×2 IMPLANT
GOWN STRL REUS W/ TWL LRG LVL3 (GOWN DISPOSABLE) IMPLANT
GOWN STRL REUS W/TWL LRG LVL3 (GOWN DISPOSABLE)
NEEDLE HYPO 25X1 1.5 SAFETY (NEEDLE) ×2 IMPLANT
NS IRRIG 1000ML POUR BTL (IV SOLUTION) IMPLANT
PACK BASIN DAY SURGERY FS (CUSTOM PROCEDURE TRAY) ×2 IMPLANT
PAD CLEANER CAUTERY TIP 5X5 (MISCELLANEOUS) ×1
PENCIL BUTTON HOLSTER BLD 10FT (ELECTRODE) ×2 IMPLANT
SLEEVE SCD COMPRESS KNEE MED (MISCELLANEOUS) IMPLANT
SPONGE INTESTINAL PEANUT (DISPOSABLE) IMPLANT
SPONGE LAP 4X18 RFD (DISPOSABLE) ×2 IMPLANT
STAPLER VISISTAT 35W (STAPLE) IMPLANT
STRIP CLOSURE SKIN 1/2X4 (GAUZE/BANDAGES/DRESSINGS) ×2 IMPLANT
SUT ETHIBOND 0 MO6 C/R (SUTURE) IMPLANT
SUT MNCRL AB 4-0 PS2 18 (SUTURE) ×2 IMPLANT
SUT NOVA NAB GS-21 1 T12 (SUTURE) ×2 IMPLANT
SUT PROLENE 0 CT 2 (SUTURE) IMPLANT
SUT PROLENE 1 CT (SUTURE) IMPLANT
SUT VIC AB 3-0 FS2 27 (SUTURE) IMPLANT
SUT VIC AB 3-0 SH 27 (SUTURE) ×1
SUT VIC AB 3-0 SH 27X BRD (SUTURE) ×1 IMPLANT
SUT VIC AB 4-0 RB1 27 (SUTURE)
SUT VIC AB 4-0 RB1 27X BRD (SUTURE) IMPLANT
SUT VIC AB 4-0 SH 27 (SUTURE)
SUT VIC AB 4-0 SH 27XANBCTRL (SUTURE) IMPLANT
SUT VIC AB 5-0 PS2 18 (SUTURE) IMPLANT
SUT VICRYL 4-0 PS2 18IN ABS (SUTURE) IMPLANT
SUT VICRYL AB 2 0 TIE (SUTURE) IMPLANT
SUT VICRYL AB 2 0 TIES (SUTURE)
SYR BULB 3OZ (MISCELLANEOUS) IMPLANT
SYR CONTROL 10ML LL (SYRINGE) ×2 IMPLANT
TOWEL GREEN STERILE FF (TOWEL DISPOSABLE) ×2 IMPLANT
TOWEL OR NON WOVEN STRL DISP B (DISPOSABLE) ×2 IMPLANT
TUBE CONNECTING 20X1/4 (TUBING) IMPLANT
YANKAUER SUCT BULB TIP NO VENT (SUCTIONS) IMPLANT

## 2018-11-09 NOTE — Anesthesia Preprocedure Evaluation (Signed)
Anesthesia Evaluation  Patient identified by MRN, date of birth, ID band Patient awake    Reviewed: Allergy & Precautions, NPO status , Patient's Chart, lab work & pertinent test results  Airway Mallampati: II  TM Distance: >3 FB Neck ROM: Full    Dental no notable dental hx.    Pulmonary neg pulmonary ROS,    Pulmonary exam normal breath sounds clear to auscultation       Cardiovascular negative cardio ROS Normal cardiovascular exam Rhythm:Regular Rate:Normal     Neuro/Psych negative neurological ROS  negative psych ROS   GI/Hepatic Neg liver ROS, GERD  ,  Endo/Other  negative endocrine ROS  Renal/GU negative Renal ROS  negative genitourinary   Musculoskeletal negative musculoskeletal ROS (+)   Abdominal   Peds negative pediatric ROS (+)  Hematology negative hematology ROS (+)   Anesthesia Other Findings   Reproductive/Obstetrics negative OB ROS                             Anesthesia Physical Anesthesia Plan  ASA: II  Anesthesia Plan: General   Post-op Pain Management:    Induction: Intravenous  PONV Risk Score and Plan: 2 and Ondansetron and Midazolam  Airway Management Planned: Oral ETT  Additional Equipment:   Intra-op Plan:   Post-operative Plan: Extubation in OR  Informed Consent: I have reviewed the patients History and Physical, chart, labs and discussed the procedure including the risks, benefits and alternatives for the proposed anesthesia with the patient or authorized representative who has indicated his/her understanding and acceptance.     Dental advisory given  Plan Discussed with: CRNA  Anesthesia Plan Comments:         Anesthesia Quick Evaluation  

## 2018-11-09 NOTE — Transfer of Care (Signed)
Immediate Anesthesia Transfer of Care Note  Patient: Franklin [name]  Procedure(s) Performed: HERNIA REPAIR UMBILICAL WITH POSSIBLE MESH ERAS PATHWAY (N/A Abdomen)  Patient Location: PACU  Anesthesia Type:General  Level of Consciousness: awake, alert  and oriented  Airway & Oxygen Therapy: Patient Spontanous Breathing and Patient connected to face mask oxygen  Post-op Assessment: Report given to RN and Post -op Vital signs reviewed and stable  Post vital signs: Reviewed and stable  Last Vitals:  Vitals Value Taken Time  BP    Temp    Pulse 77 11/09/2018  8:37 AM  Resp 15 11/09/2018  8:37 AM  SpO2 100 % 11/09/2018  8:37 AM  Vitals shown include unvalidated device data.  Last Pain:  Vitals:   11/09/18 0639  TempSrc: Oral  PainSc: 0-No pain         Complications: No apparent anesthesia complications

## 2018-11-09 NOTE — H&P (Signed)
Franklin Franklin [name] Documented: 09/25/2018 9:02 AM Location: [facility] Patient #: [id number] DOB: 10-Feb-1956 Married / Language: English / Race: White Male   History of Present Illness Franklin Franklin [name]. Franklin Mcnew MD; 09/25/2018 9:20 AM) The patient is Franklin [name] 62 year old male who presents with an umbilical hernia. This hernia is thought to be acquired. Symptoms include bulge at the umbilicus and abdominal pain. The pain is located in the mid-abdomen. The patient describes the pain as dull and aching. Onset was gradual 4 week(s) ago. The patient describes this as mild and unchanged. Symptoms are relieved by recumbency. The patient is not currently being treated for this problem.   Past Surgical History (Franklin Franklin [name], [place]; 09/25/2018 9:02 AM) Appendectomy  Cataract Surgery  Bilateral. Colon Polyp Removal - Colonoscopy  Oral Surgery  Tonsillectomy  Vasectomy   Diagnostic Studies History (Franklin Franklin [name], Franklin Franklin [name]; 09/25/2018 9:02 AM) Colonoscopy  1-5 years ago  Allergies (Franklin Franklin [name], Franklin Franklin [name]; 09/25/2018 9:03 AM) No Known Drug Allergies [09/25/2018]: Allergies Reconciled   Medication History (Franklin Franklin [name], Franklin Franklin [name]; 09/25/2018 9:04 AM) Simvastatin (20MG  Tablet, Oral) Active. Aspirin (162MG  Tablet DR, Oral) Active. Medications Reconciled  Social History (Franklin Franklin [name], Franklin Franklin [name]; 09/25/2018 9:02 AM) Alcohol use  Moderate alcohol use. Caffeine use  Coffee. No drug use  Tobacco use  Never smoker.  Family History (Franklin Franklin [name], Franklin [name]; 09/25/2018 9:02 AM) Cancer  Brother, Father. Cerebrovascular Accident  Mother. Heart disease in male family member before age 74   Other Problems (Franklin Franklin [name], [place]; 09/25/2018 9:02 AM) Gastroesophageal Reflux Disease  Hypercholesterolemia  Umbilical Hernia Repair     Review of Systems (Franklin Franklin [name]; 09/25/2018 9:02 AM) HEENT Present- Wears glasses/contact lenses. Not Present- Earache, Hearing Loss, Hoarseness, Nose  Bleed, Oral Ulcers, Ringing in the Ears, Seasonal Allergies, Sinus Pain, Sore Throat, Visual Disturbances and Yellow Eyes. Respiratory Not Present- Bloody sputum, Chronic Cough, Difficulty Breathing, Snoring and Wheezing. Gastrointestinal Not Present- Abdominal Pain, Bloating, Bloody Stool, Change in Bowel Habits, Chronic diarrhea, Constipation, Difficulty Swallowing, Excessive gas, Gets full quickly at meals, Hemorrhoids, Indigestion, Nausea, Rectal Pain and Vomiting. Male Genitourinary Not Present- Blood in Urine, Change in Urinary Stream, Frequency, Impotence, Nocturia, Painful Urination, Urgency and Urine Leakage. Musculoskeletal Not Present- Back Pain, Joint Pain, Joint Stiffness, Muscle Pain, Muscle Weakness and Swelling of Extremities.  Vitals (Franklin Franklin [name]; 09/25/2018 9:03 AM) 09/25/2018 9:03 AM Weight: 162.2 lb Height: 66in Body Surface Area: 1.83 m Body Mass Index: 26.18 kg/m  Temp.: 98.29F  Pulse: 66 (Regular)  BP: 116/78 (Sitting, Left Arm, Standard) BP today 117/70 P 65   Physical Exam (Yvett Rossel O. Hulen Skains MD; 09/25/2018 9:22 AM) General Mental Status-Alert. General Appearance-Well groomed. Note: Looks younger than stated age. Orientation-Oriented X4. [place], Well nourished and Well developed.  Chest and Lung Exam Chest and lung exam reveals -normal excursion with symmetric chest walls, quiet, even and easy respiratory effort with no use of accessory muscles, non-tender and normal tactile fremitus and on auscultation, normal breath sounds, no adventitious sounds and normal vocal resonance.  Cardiovascular Cardiovascular examination reveals -normal heart sounds, regular rate and rhythm with no murmurs and femoral artery auscultation bilaterally reveals normal pulses, no bruits, no thrills.  Abdomen Inspection Hernias - Umbilical hernia - Reducible(Tender with reduction. 1-2 cm defect).  Has some superior  diastasis.  Assessment & Plan Jeneen Rinks O. Lanell Carpenter MD; 31/49/7026 3:78 AM) UMBILICAL HERNIA WITHOUT OBSTRUCTION AND WITHOUT GANGRENE (K42.9) Story: First noted in July, 2019 Impression: Reducible, small umbilical  hernia. 1-2 cm defect. Can rreduce fully. Can repair without mesh.  Patient wants to consider options and get back with [place] about scheduling repair. Current Plans:  Open repair, likely without mesh  Discussed plan with the patient and his wife  Franklin Franklin [name]. Dahlia Bailiff, MD, [place] [phone] [facility]

## 2018-11-09 NOTE — Anesthesia Postprocedure Evaluation (Signed)
Anesthesia Post Note  Patient: Franklin [name]  Procedure(s) Performed: HERNIA REPAIR UMBILICAL WITH POSSIBLE MESH ERAS PATHWAY (N/A Abdomen)     Patient location during evaluation: PACU Anesthesia Type: General Level of consciousness: awake and alert Pain management: pain level controlled Vital Signs Assessment: post-procedure vital signs reviewed and stable Respiratory status: spontaneous breathing, nonlabored ventilation and respiratory function stable Cardiovascular status: blood pressure returned to baseline and stable Postop Assessment: no apparent nausea or vomiting Anesthetic complications: no    Last Vitals:  Vitals:   11/09/18 0901 11/09/18 0930  BP: 126/81 130/74  Pulse: (!) 57 (!) 56  Resp: 14 16  Temp:  36.6 C  SpO2: 100% 100%    Last Pain:  Vitals:   11/09/18 0930  TempSrc:   PainSc: 2                  Lynda Rainwater

## 2018-11-09 NOTE — Op Note (Signed)
OPERATIVE REPORT  DATE OF OPERATION: 11/09/2018  PATIENT:  Franklin [name]  62 y.o. male  PRE-OPERATIVE DIAGNOSIS:  Symptomatic umbilical hernia  POST-OPERATIVE DIAGNOSIS:  Symptomatic umbilical hernia  INDICATION(S) FOR OPERATION: Symptomatic umbilical hernia  FINDINGS: 6-2/2 cm umbilical fascial defect with incarcerated preperitoneal fat and small amount of omentum with a small hernia sac.  PROCEDURE:  Procedure(s): HERNIA REPAIR UMBILICAL WITH POSSIBLE MESH ERAS PATHWAY  SURGEON:  Surgeon(s): Judeth Horn, MD  ASSISTANT: None  ANESTHESIA:   general  COMPLICATIONS: None  EBL: Less than 10 ml  BLOOD ADMINISTERED: none  DRAINS: none   SPECIMEN:  No Specimen  COUNTS CORRECT:  YES  PROCEDURE DETAILS: The patient was taken to the operating room and placed on the table in the supine position.  After an adequate general endotracheal anesthetic was administered, he was prepped and draped in usual sterile manner exposing his periumbilical area.  A proper timeout was performed identifying the patient and procedure to be performed.  We marked the area of tendon incision in the infraumbilical area approximately 5 cm long.  He made an incision using #15 blade and dissected down to subcutaneous tissues electrocautery and Metzenbaum scissors.  The superior portion of the hernia sac was attached to the skin underlying the umbilicus.  This was dissected free using Metzenbaum scissors.  We isolated the hernia sac down to the fascial edges which were noted to be in good condition and not very patent.  We excised the hernia sac at the edge of the fascial edge and then subsequently dissected out a flap laterally into subcutaneous tissue in order to identify the fascia adequately for suture.  Once we have the 2 and half centimeter hernia defect well isolated we repaired using interrupted figure-of-eight stitches of #1 Novafil.  3 such interrupted sutures were placed and then we followed that by  lifting up on the fascia with the end sutures that have been tag and then running a #1 no refill from back-and-forth and tying it at the end on the patient's left side.  We irrigated with saline solution then reapproximated subcutaneous tissue using interrupted 3-0 Vicryl suture.  We placed a stitch from the fascia to the connective tissue in the underside of the umbilicus in order to maintain its inversion.  We then closed the skin using running subcuticular stitch of 4-0 Monocryl.  Prior to closure 20 mL of Exparel were was injected into the subcutaneous tissue and the subcuticular tissue.  Skin closure was reinforced with Dermabond, Steri-Strips, and Tegaderm.    All needle counts, sponge counts, and instrument counts were correct.  PATIENT DISPOSITION:  PACU - hemodynamically stable.   Judeth Horn 12/13/20198:35 AM

## 2018-11-09 NOTE — Discharge Instructions (Addendum)
Post Anesthesia Home Care Instructions  Activity: Get plenty of rest for the remainder of the day. A responsible individual must stay with you for 24 hours following the procedure.  For the next 24 hours, DO NOT: -Drive a car -Paediatric nurse -Drink alcoholic beverages -Take any medication unless instructed by your physician -Make any legal decisions or sign important papers.  Meals: Start with liquid foods such as gelatin or soup. Progress to regular foods as tolerated. Avoid greasy, spicy, heavy foods. If nausea and/or vomiting occur, drink only clear liquids until the nausea and/or vomiting subsides. Call your physician if vomiting continues.  Special Instructions/Symptoms: Your throat may feel dry or sore from the anesthesia or the breathing tube placed in your throat during surgery. If this causes discomfort, gargle with warm salt water. The discomfort should disappear within 24 hours.  If you had a scopolamine patch placed behind your ear for the management of post- operative nausea and/or vomiting:  1. The medication in the patch is effective for 72 hours, after which it should be removed.  Wrap patch in a tissue and discard in the trash. Wash hands thoroughly with soap and water. 2. You may remove the patch earlier than 72 hours if you experience unpleasant side effects which may include dry mouth, dizziness or visual disturbances. 3. Avoid touching the patch. Wash your hands with soap and water after contact with the patch.     Information for Discharge Teaching: EXPAREL (bupivacaine liposome injectable suspension)   Your surgeon or anesthesiologist gave you EXPAREL(bupivacaine) to help control your pain after surgery.   EXPAREL is a local anesthetic that provides pain relief by numbing the tissue around the surgical site.  EXPAREL is designed to release pain medication over time and can control pain for up to 72 hours.  Depending on how you respond to EXPAREL, you may  require less pain medication during your recovery.  Possible side effects:  Temporary loss of sensation or ability to move in the area where bupivacaine was injected.  Nausea, vomiting, constipation  Rarely, numbness and tingling in your mouth or lips, lightheadedness, or anxiety may occur.  Call your doctor right away if you think you may be experiencing any of these sensations, or if you have other questions regarding possible side effects.  Follow all other discharge instructions given to you by your surgeon or nurse. Eat a healthy diet and drink plenty of water or other fluids.  If you return to the hospital for any reason within 96 hours following the administration of EXPAREL, it is important for health care providers to know that you have received this anesthetic. A teal colored band has been placed on your arm with the date, time and amount of EXPAREL you have received in order to alert and inform your health care providers. Please leave this armband in place for the full 96 hours following administration, and then you may remove the band.    Open Hernia Repair, Adult, Care After These instructions give you information about caring for yourself after your procedure. Your doctor may also give you more specific instructions. If you have problems or questions, contact your doctor. Follow these instructions at home: Surgical cut (incision) care   Follow instructions from your doctor about how to take care of your surgical cut area. Make sure you: ? Wash your hands with soap and water before you change your bandage (dressing). If you cannot use soap and water, use hand sanitizer. ? Change your bandage as  told by your doctor. ? Leave stitches (sutures), skin glue, or skin tape (adhesive) strips in place. They may need to stay in place for 2 weeks or longer. If tape strips get loose and curl up, you may trim the loose edges. Do not remove tape strips completely unless your doctor says it  is okay.  Check your surgical cut every day for signs of infection. Check for: ? More redness, swelling, or pain. ? More fluid or blood. ? Warmth. ? Pus or a bad smell. Activity  Do not drive or use heavy machinery while taking prescription pain medicine. Do not drive until your doctor says it is okay.  Until your doctor says it is okay: ? Do not lift anything that is heavier than 10 lb (4.5 kg). ? Do not play contact sports.  Return to your normal activities as told by your doctor. Ask your doctor what activities are safe. General instructions  To prevent or treat having a hard time pooping (constipation) while you are taking prescription pain medicine, your doctor may recommend that you: ? Drink enough fluid to keep your pee (urine) clear or pale yellow. ? Take over-the-counter or prescription medicines. ? Eat foods that are high in fiber, such as fresh fruits and vegetables, whole grains, and beans. ? Limit foods that are high in fat and processed sugars, such as fried and sweet foods. ? Leave dressing intact until seen in clinic if it remains dry  Take over-the-counter and prescription medicines only as told by your doctor.  Do not take baths, swim, or use a hot tub until your doctor says it is okay.  Keep all follow-up visits as told by your doctor. This is important. Contact a doctor if:  You develop a rash.  You have more redness, swelling, or pain around your surgical cut.  You have more fluid or blood coming from your surgical cut.  Your surgical cut feels warm to the touch.  You have pus or a bad smell coming from your surgical cut.  You have a fever or chills.  You have blood in your poop (stool).  You have not pooped in 2-3 days.  Medicine does not help your pain. Get help right away if:  You have chest pain or you are short of breath.  You feel light-headed.  You feel weak and dizzy (feel faint).  You have very bad pain.  You throw up (vomit)  and your pain is worse. This information is not intended to replace advice given to you by your health care provider. Make sure you discuss any questions you have with your health care provider. Document Released: 12/05/2014 Document Revised: 06/03/2016 Document Reviewed: 04/27/2016 Elsevier Interactive Patient Education  2018 [place] Anesthesia Home Care Instructions  Activity: Get plenty of rest for the remainder of the day. A responsible individual must stay with you for 24 hours following the procedure.  For the next 24 hours, DO NOT: -Drive a car -Paediatric nurse -Drink alcoholic beverages -Take any medication unless instructed by your physician -Make any legal decisions or sign important papers.  Meals: Start with liquid foods such as gelatin or soup. Progress to regular foods as tolerated. Avoid greasy, spicy, heavy foods. If nausea and/or vomiting occur, drink only clear liquids until the nausea and/or vomiting subsides. Call your physician if vomiting continues.  Special Instructions/Symptoms: Your throat may feel dry or sore from the anesthesia or the breathing tube placed in your throat during surgery. If  this causes discomfort, gargle with warm salt water. The discomfort should disappear within 24 hours.  If you had a scopolamine patch placed behind your ear for the management of post- operative nausea and/or vomiting:  1. The medication in the patch is effective for 72 hours, after which it should be removed.  Wrap patch in a tissue and discard in the trash. Wash hands thoroughly with soap and water. 2. You may remove the patch earlier than 72 hours if you experience unpleasant side effects which may include dry mouth, dizziness or visual disturbances. 3. Avoid touching the patch. Wash your hands with soap and water after contact with the patch.

## 2018-11-12 ENCOUNTER — Encounter (HOSPITAL_BASED_OUTPATIENT_CLINIC_OR_DEPARTMENT_OTHER): Payer: Self-pay | Admitting: General Surgery

## 2019-04-29 IMAGING — CR DG CHEST 2V
2 series · 2 of 2 positions shown · non-contrast
Comparison: None.

CLINICAL DATA: Preoperative examination prior to umbilical hernia
surgery. No current complaints. Nonsmoker.

EXAM:
CHEST - 2 VIEW

[w chest pa]
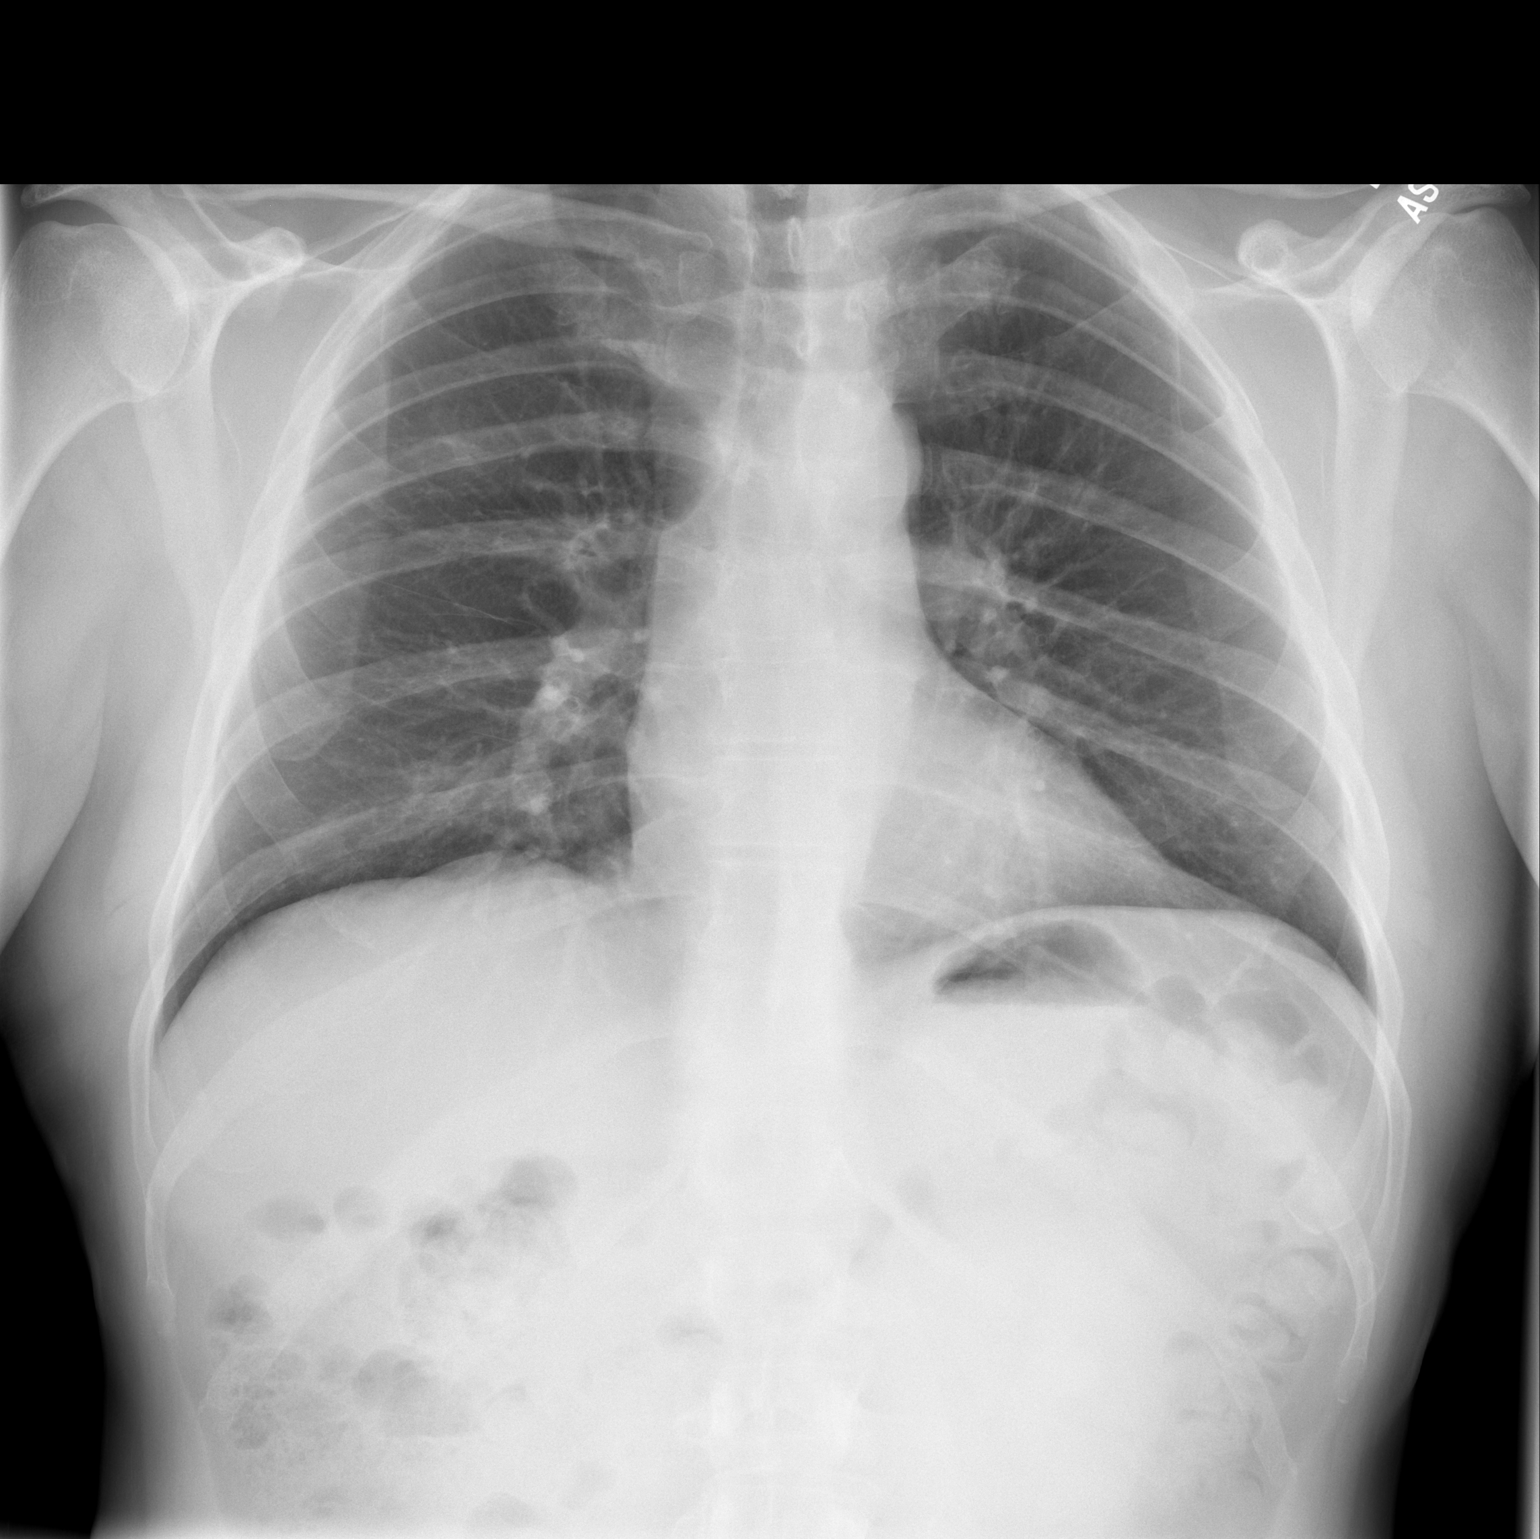

[w chest lat]
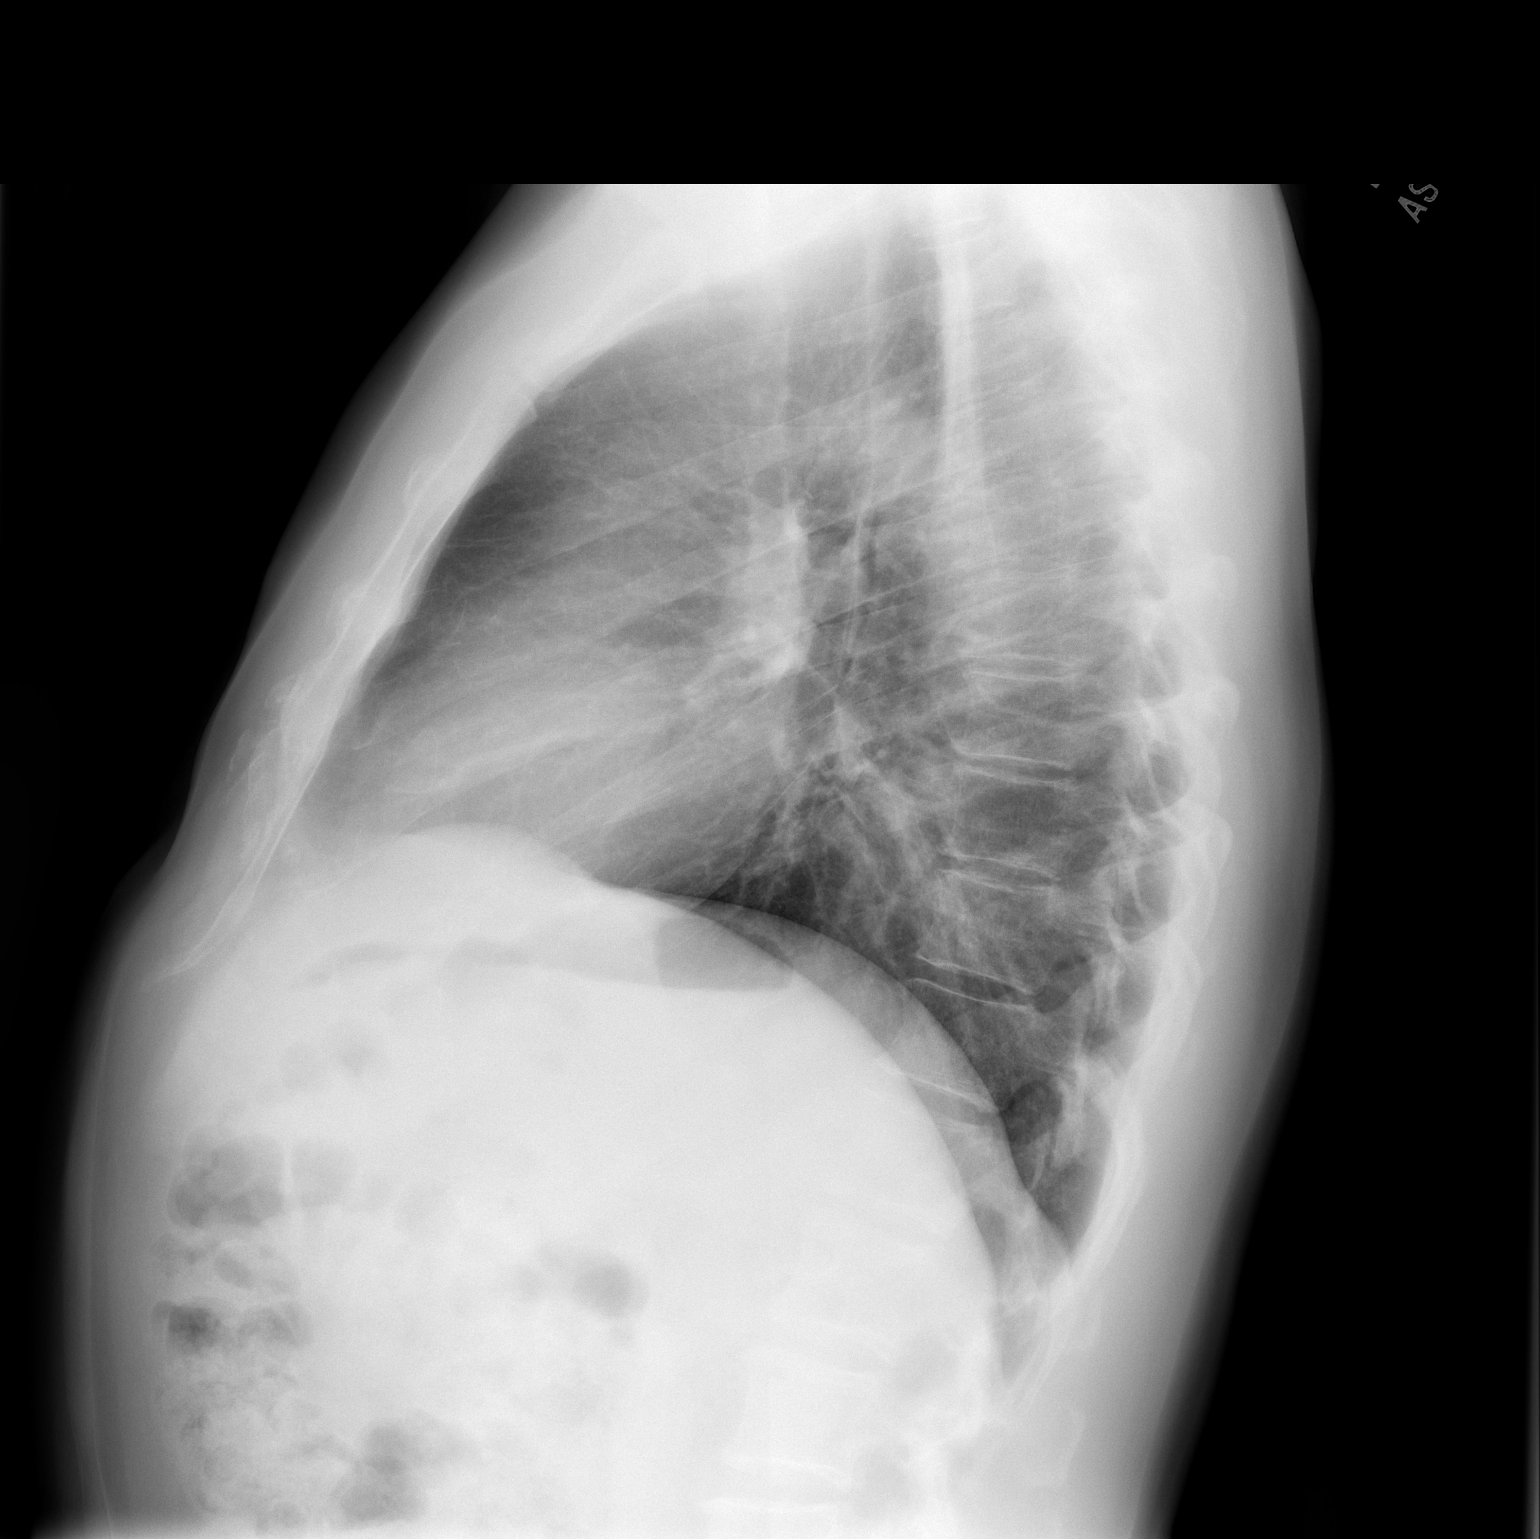

[2 of 2 positions shown; findings below may reference images not displayed]

FINDINGS: The lungs are adequately inflated and clear. The heart and pulmonary
vascularity are normal. The mediastinum is normal in width. The
trachea is midline. The bony thorax exhibits no acute abnormality.
IMPRESSION: There is no active cardiopulmonary disease.

## 2019-09-04 ENCOUNTER — Ambulatory Visit (INDEPENDENT_AMBULATORY_CARE_PROVIDER_SITE_OTHER): Payer: Managed Care, Other (non HMO) | Admitting: Family Medicine

## 2019-09-04 ENCOUNTER — Encounter: Payer: Self-pay | Admitting: Family Medicine

## 2019-09-04 ENCOUNTER — Other Ambulatory Visit: Payer: Self-pay

## 2019-09-04 VITALS — BP 102/68 | HR 56 | Temp 97.6°F | Wt 161.0 lb

## 2019-09-04 DIAGNOSIS — Z125 Encounter for screening for malignant neoplasm of prostate: Secondary | ICD-10-CM | POA: Diagnosis not present

## 2019-09-04 DIAGNOSIS — Z131 Encounter for screening for diabetes mellitus: Secondary | ICD-10-CM

## 2019-09-04 DIAGNOSIS — Z23 Encounter for immunization: Secondary | ICD-10-CM | POA: Diagnosis not present

## 2019-09-04 DIAGNOSIS — Z0000 Encounter for general adult medical examination without abnormal findings: Secondary | ICD-10-CM

## 2019-09-04 DIAGNOSIS — E782 Mixed hyperlipidemia: Secondary | ICD-10-CM | POA: Diagnosis not present

## 2019-09-04 LAB — CBC WITH DIFFERENTIAL/PLATELET
Basophils Absolute: 0 10*3/uL (ref 0.0–0.1)
Basophils Relative: 0.8 % (ref 0.0–3.0)
Eosinophils Absolute: 0.2 10*3/uL (ref 0.0–0.7)
Eosinophils Relative: 3 % (ref 0.0–5.0)
HCT: 45.4 % (ref 39.0–52.0)
Hemoglobin: 15.7 g/dL (ref 13.0–17.0)
Lymphocytes Relative: 22.4 % (ref 12.0–46.0)
Lymphs Abs: 1.3 10*3/uL (ref 0.7–4.0)
MCHC: 34.7 g/dL (ref 30.0–36.0)
MCV: 96.6 fl (ref 78.0–100.0)
Monocytes Absolute: 0.5 10*3/uL (ref 0.1–1.0)
Monocytes Relative: 7.7 % (ref 3.0–12.0)
Neutro Abs: 3.9 10*3/uL (ref 1.4–7.7)
Neutrophils Relative %: 66.1 % (ref 43.0–77.0)
Platelets: 180 10*3/uL (ref 150.0–400.0)
RBC: 4.69 Mil/uL (ref 4.22–5.81)
RDW: 12.6 % (ref 11.5–15.5)
WBC: 5.9 10*3/uL (ref 4.0–10.5)

## 2019-09-04 LAB — LIPID PANEL
Cholesterol: 137 mg/dL (ref 0–200)
HDL: 42.9 mg/dL (ref >39.00)
LDL Cholesterol: 68 mg/dL (ref 0–99)
NonHDL: 94.26
Total CHOL/HDL Ratio: 3
Triglycerides: 129 mg/dL (ref 0.0–149.0)
VLDL: 25.8 mg/dL (ref 0.0–40.0)

## 2019-09-04 LAB — BASIC METABOLIC PANEL
BUN: 13 mg/dL (ref 6–23)
CO2: 29 mEq/L (ref 19–32)
Calcium: 9.7 mg/dL (ref 8.4–10.5)
Chloride: 104 mEq/L (ref 96–112)
Creatinine, Ser: 0.83 mg/dL (ref 0.40–1.50)
GFR: 93.33 mL/min (ref >60.00)
Glucose, Bld: 111 mg/dL — ABNORMAL HIGH (ref 70–99)
Potassium: 4.7 mEq/L (ref 3.5–5.1)
Sodium: 141 mEq/L (ref 135–145)

## 2019-09-04 LAB — HEMOGLOBIN A1C: Hgb A1c MFr Bld: 5.8 % (ref 4.6–6.5)

## 2019-09-04 LAB — PSA: PSA: 1.65 ng/mL (ref 0.10–4.00)

## 2019-09-04 MED ORDER — SIMVASTATIN 20 MG PO TABS: 20.0000 mg | ORAL_TABLET | Freq: Every day | ORAL | 3 refills | Status: DC

## 2019-09-04 NOTE — Patient Instructions (Signed)
Preventive Care 40-64 Years Old, Male Preventive care refers to lifestyle choices and visits with your health care provider that can promote health and wellness. This includes:  A yearly physical exam. This is also called an annual well check.  Regular dental and eye exams.  Immunizations.  Screening for certain conditions.  Healthy lifestyle choices, such as eating a healthy diet, getting regular exercise, not using drugs or products that contain nicotine and tobacco, and limiting alcohol use. What can I expect for my preventive care visit? Physical exam Your health care provider will check:  Height and weight. These may be used to calculate body mass index (BMI), which is a measurement that tells if you are at a healthy weight.  Heart rate and blood pressure.  Your skin for abnormal spots. Counseling Your health care provider may ask you questions about:  Alcohol, tobacco, and drug use.  Emotional well-being.  Home and relationship well-being.  Sexual activity.  Eating habits.  Work and work environment. What immunizations do I need?  Influenza (flu) vaccine  This is recommended every year. Tetanus, diphtheria, and pertussis (Tdap) vaccine  You may need a Td booster every 10 years. Varicella (chickenpox) vaccine  You may need this vaccine if you have not already been vaccinated. Zoster (shingles) vaccine  You may need this after age 60. Measles, mumps, and rubella (MMR) vaccine  You may need at least one dose of MMR if you were born in 1957 or later. You may also need a second dose. Pneumococcal conjugate (PCV13) vaccine  You may need this if you have certain conditions and were not previously vaccinated. Pneumococcal polysaccharide (PPSV23) vaccine  You may need one or two doses if you smoke cigarettes or if you have certain conditions. Meningococcal conjugate (MenACWY) vaccine  You may need this if you have certain conditions. Hepatitis A vaccine   You may need this if you have certain conditions or if you travel or work in places where you may be exposed to hepatitis A. Hepatitis B vaccine  You may need this if you have certain conditions or if you travel or work in places where you may be exposed to hepatitis B. Haemophilus influenzae type b (Hib) vaccine  You may need this if you have certain risk factors. Human papillomavirus (HPV) vaccine  If recommended by your health care provider, you may need three doses over 6 months. You may receive vaccines as individual doses or as more than one vaccine together in one shot (combination vaccines). Talk with your health care provider about the risks and benefits of combination vaccines. What tests do I need? Blood tests  Lipid and cholesterol levels. These may be checked every 5 years, or more frequently if you are over 50 years old.  Hepatitis C test.  Hepatitis B test. Screening  Lung cancer screening. You may have this screening every year starting at age 55 if you have a 30-pack-year history of smoking and currently smoke or have quit within the past 15 years.  Prostate cancer screening. Recommendations will vary depending on your family history and other risks.  Colorectal cancer screening. All adults should have this screening starting at age 50 and continuing until age 75. Your health care provider may recommend screening at age 45 if you are at increased risk. You will have tests every 1-10 years, depending on your results and the type of screening test.  Diabetes screening. This is done by checking your blood sugar (glucose) after you have not eaten   for a while (fasting). You may have this done every 1-3 years.  Sexually transmitted disease (STD) testing. Follow these instructions at home: Eating and drinking  Eat a diet that includes fresh fruits and vegetables, whole grains, lean protein, and low-fat dairy products.  Take vitamin and mineral supplements as recommended  by your health care provider.  Do not drink alcohol if your health care provider tells you not to drink.  If you drink alcohol: ? Limit how much you have to 0-2 drinks a day. ? Be aware of how much alcohol is in your drink. In the [place] one drink equals one 12 oz bottle of beer (355 mL), one 5 oz glass of wine (148 mL), or one 1 oz glass of hard liquor (44 mL). Lifestyle  Take daily care of your teeth and gums.  Stay active. Exercise for at least 30 minutes on 5 or more days each week.  Do not use any products that contain nicotine or tobacco, such as cigarettes, e-cigarettes, and chewing tobacco. If you need help quitting, ask your health care provider.  If you are sexually active, practice safe sex. Use a condom or other form of protection to prevent STIs (sexually transmitted infections).  Talk with your health care provider about taking a low-dose aspirin every day starting at age 35. What's next?  Go to your health care provider once a year for a well check visit.  Ask your health care provider how often you should have your eyes and teeth checked.  Stay up to date on all vaccines. This information is not intended to replace advice given to you by your health care provider. Make sure you discuss any questions you have with your health care provider. Document Released: 12/11/2015 Document Revised: 11/08/2018 Document Reviewed: 11/08/2018 Elsevier Patient Education  [place].  High Cholesterol  High cholesterol is a condition in which the blood has high levels of a white, waxy, fat-like substance (cholesterol). The human body needs small amounts of cholesterol. The liver makes all the cholesterol that the body needs. Extra (excess) cholesterol comes from the food that we eat. Cholesterol is carried from the liver by the blood through the blood vessels. If you have high cholesterol, deposits (plaques) may build up on the walls of your blood vessels (arteries). Plaques  make the arteries narrower and stiffer. Cholesterol plaques increase your risk for heart attack and stroke. Work with your health care provider to keep your cholesterol levels in a healthy range. What increases the risk? This condition is more likely to develop in people who:  Eat foods that are high in animal fat (saturated fat) or cholesterol.  Are overweight.  Are not getting enough exercise.  Have a family history of high cholesterol. What are the signs or symptoms? There are no symptoms of this condition. How is this diagnosed? This condition may be diagnosed from the results of a blood test.  If you are older than age 72, your health care provider may check your cholesterol every 4-6 years.  You may be checked more often if you already have high cholesterol or other risk factors for heart disease. The blood test for cholesterol measures:  "Bad" cholesterol (LDL cholesterol). This is the main type of cholesterol that causes heart disease. The desired level for LDL is less than 100.  "Good" cholesterol (HDL cholesterol). This type helps to protect against heart disease by cleaning the arteries and carrying the LDL away. The desired level for HDL is 60  or higher.  Triglycerides. These are fats that the body can store or burn for energy. The desired number for triglycerides is lower than 150.  Total cholesterol. This is a measure of the total amount of cholesterol in your blood, including LDL cholesterol, HDL cholesterol, and triglycerides. A healthy number is less than 200. How is this treated? This condition is treated with diet changes, lifestyle changes, and medicines. Diet changes  This may include eating more whole grains, fruits, vegetables, nuts, and fish.  This may also include cutting back on red meat and foods that have a lot of added sugar. Lifestyle changes  Changes may include getting at least 40 minutes of aerobic exercise 3 times a week. Aerobic exercises  include walking, biking, and swimming. Aerobic exercise along with a healthy diet can help you maintain a healthy weight.  Changes may also include quitting smoking. Medicines  Medicines are usually given if diet and lifestyle changes have failed to reduce your cholesterol to healthy levels.  Your health care provider may prescribe a statin medicine. Statin medicines have been shown to reduce cholesterol, which can reduce the risk of heart disease. Follow these instructions at home: Eating and drinking If told by your health care provider:  Eat chicken (without skin), fish, veal, shellfish, ground [place] breast, and round or loin cuts of red meat.  Do not eat fried foods or fatty meats, such as hot dogs and salami.  Eat plenty of fruits, such as apples.  Eat plenty of vegetables, such as broccoli, potatoes, and carrots.  Eat beans, peas, and lentils.  Eat grains such as barley, rice, couscous, and bulgur wheat.  Eat pasta without cream sauces.  Use skim or nonfat milk, and eat low-fat or nonfat yogurt and cheeses.  Do not eat or drink whole milk, cream, ice cream, egg yolks, or hard cheeses.  Do not eat stick margarine or tub margarines that contain trans fats (also called partially hydrogenated oils).  Do not eat saturated tropical oils, such as coconut oil and palm oil.  Do not eat cakes, cookies, crackers, or other baked goods that contain trans fats.  General instructions  Exercise as directed by your health care provider. Increase your activity level with activities such as gardening, walking, and taking the stairs.  Take over-the-counter and prescription medicines only as told by your health care provider.  Do not use any products that contain nicotine or tobacco, such as cigarettes and e-cigarettes. If you need help quitting, ask your health care provider.  Keep all follow-up visits as told by your health care provider. This is important. Contact a health care  provider if:  You are struggling to maintain a healthy diet or weight.  You need help to start on an exercise program.  You need help to stop smoking. Get help right away if:  You have chest pain.  You have trouble breathing. This information is not intended to replace advice given to you by your health care provider. Make sure you discuss any questions you have with your health care provider. Document Released: 11/14/2005 Document Revised: 11/17/2017 Document Reviewed: 05/14/2016 Elsevier Patient Education  2020 [employer].

## 2019-09-04 NOTE — Addendum Note (Signed)
Addended by: Grier Mitts R on: 09/04/2019 09:14 AM   Modules accepted: Orders

## 2019-09-04 NOTE — Addendum Note (Signed)
Addended by: Wyvonne Lenz on: 09/04/2019 08:39 AM   Modules accepted: Orders

## 2019-09-04 NOTE — Progress Notes (Signed)
Subjective:     Franklin [name] is a 63 y.o. male and is here for a comprehensive physical exam. The patient reports no problems.  Pt had umbilical hernia repair [id number].  Patient states procedure went well.  He expands mild discomfort afterwards.  Patient states he lost a few pounds.  Patient is eating healthier and walking 45 minutes 3-4 times per week.  Patient is currently working part-time and helping to take care of his grandchildren.  Patient requesting refill on Zocor 20 mg.  Denies myalgias.  Patient endorses family history of HLD and DM2.  Social History   Socioeconomic History  . Marital status: Married    Spouse name: Not on file  . Number of children: Not on file  . Years of education: Not on file  . Highest education level: Not on file  Occupational History  . Not on file  Social Needs  . Financial resource strain: Not on file  . Food insecurity    Worry: Not on file    Inability: Not on file  . Transportation needs    Medical: Not on file    Non-medical: Not on file  Tobacco Use  . Smoking status: Never Smoker  . Smokeless tobacco: Never Used  Substance and Sexual Activity  . Alcohol use: Yes    Comment: OCCASIONAL  . Drug use: Never  . Sexual activity: Not on file  Lifestyle  . Physical activity    Days per week: Not on file    Minutes per session: Not on file  . Stress: Not on file  Relationships  . Social Herbalist on phone: Not on file    Gets together: Not on file    Attends religious service: Not on file    Active member of club or organization: Not on file    Attends meetings of clubs or organizations: Not on file    Relationship status: Not on file  . Intimate partner violence    Fear of current or ex partner: Not on file    Emotionally abused: Not on file    Physically abused: Not on file    Forced sexual activity: Not on file  Other Topics Concern  . Not on file  Social History Narrative  . Not on file   Health Maintenance   Topic Date Due  . Hepatitis C Screening  08-18-1956  . HIV Screening  11/11/1970  . TETANUS/TDAP  11/11/1974  . COLONOSCOPY  11/11/2005  . INFLUENZA VACCINE  06/29/2019    The following portions of the patient's history were reviewed and updated as appropriate: allergies, current medications, past family history, past medical history, past social history, past surgical history and problem list.  Review of Systems Pertinent items noted in HPI and remainder of comprehensive ROS otherwise negative.   Objective:    BP 102/68 (BP Location: Left Arm, Patient Position: Sitting, Cuff Size: Normal)   Pulse (!) 56   Temp 97.6 F (36.4 C) (Oral)   Wt 161 lb (73 kg)   SpO2 98%   BMI 25.99 kg/m  General appearance: alert, cooperative and no distress Head: Normocephalic, without obvious abnormality, atraumatic Eyes: conjunctivae/corneas clear. PERRL, EOM's intact. Fundi benign. Ears: normal TM's and external ear canals both ears Nose: Nares normal. Septum midline. Mucosa normal. No drainage or sinus tenderness. Throat: lips, mucosa, and tongue normal; teeth and gums normal Neck: no adenopathy, no carotid bruit, no JVD, supple, symmetrical, trachea midline and thyroid not enlarged, symmetric, no  tenderness/mass/nodules Lungs: clear to auscultation bilaterally Heart: regular rate and rhythm, S1, S2 normal, no murmur, click, rub or gallop Abdomen: soft, non-tender; bowel sounds normal; no masses,  no organomegaly Extremities: extremities normal, atraumatic, no cyanosis or edema Pulses: 2+ and symmetric Skin: Skin color, texture, turgor normal. No rashes or lesions  Well healed surgical incision at inferior umbilicus, no hernia present. Lymph nodes: Cervical, supraclavicular, and axillary nodes normal. Neurologic: Alert and oriented X 3, normal strength and tone. Normal symmetric reflexes. Normal coordination and gait    Assessment:    Healthy male exam.      Plan:     Anticipatory  guidance given including wearing seatbelts, smoke detectors in the home, increasing physical activity, increasing p.o. intake of water and vegetables. -will obtain labs -colonoscopy up to date done 2017 -will give influenza vaccine this visit.  Consider shingles  -given handout -next CPE in 1 yr See After Visit Summary for Counseling Recommendations    HLD -Continue Zocor milligrams -We will obtain lipid panel next visit -Discussed lifestyle modifications -Given handout  Screen for diabetes -Given family history we will obtain hemoglobin A1c -Discussed lifestyle modifications  Screen for prostate cancer -We will obtain PSA  Need for influenza vaccine -Flu vaccine given this visit  Follow-up in 1 year for CPE, otherwise PRN  Grier Mitts, MD

## 2020-01-04 ENCOUNTER — Ambulatory Visit: Payer: PRIVATE HEALTH INSURANCE | Attending: Internal Medicine

## 2020-01-04 DIAGNOSIS — Z23 Encounter for immunization: Secondary | ICD-10-CM | POA: Insufficient documentation

## 2020-01-04 NOTE — Progress Notes (Signed)
   Covid-19 Vaccination Clinic  Name:  Franklin [name]    MRN: [id number] DOB: 24-Jun-1956  01/04/2020  [name] was observed post Covid-19 immunization for 15 minutes without incidence. He was provided with Vaccine Information Sheet and instruction to access the V-Safe system.   [name] was instructed to call 911 with any severe reactions post vaccine: Marland Kitchen Difficulty breathing  . Swelling of your face and throat  . A fast heartbeat  . A bad rash all over your body  . Dizziness and weakness    Immunizations Administered    Name Date Dose VIS Date Route   Pfizer COVID-19 Vaccine 01/04/2020 11:53 AM 0.3 mL 11/08/2019 Intramuscular   Manufacturer: [place]   Lot: [id number]   [place]: [id number]

## 2020-01-07 ENCOUNTER — Telehealth: Payer: Self-pay | Admitting: Family Medicine

## 2020-01-07 ENCOUNTER — Encounter: Payer: Self-pay | Admitting: Family Medicine

## 2020-01-07 ENCOUNTER — Other Ambulatory Visit: Payer: Self-pay

## 2020-01-07 DIAGNOSIS — E782 Mixed hyperlipidemia: Secondary | ICD-10-CM

## 2020-01-07 NOTE — Telephone Encounter (Signed)
Pt is requesting a refill on simvastatin (ZOCOR) 20 MG Tablet. Please send prescription to [facility] phone # [phone].

## 2020-01-07 NOTE — Telephone Encounter (Signed)
Spoke with pt scheduled for 01/09/2020 at 11.30 am for in office visit with Dr Volanda Napoleon

## 2020-01-07 NOTE — Telephone Encounter (Signed)
Left a message for pt to call the office and clarify the pharmacy address where to send pt simvastatin

## 2020-01-08 ENCOUNTER — Other Ambulatory Visit: Payer: Self-pay

## 2020-01-08 DIAGNOSIS — E782 Mixed hyperlipidemia: Secondary | ICD-10-CM

## 2020-01-08 NOTE — Telephone Encounter (Signed)
Spoke with pt state that he has a new mail order pharmacy for his Rx, pt will update the office of the pharmacy that he is currently using

## 2020-01-09 ENCOUNTER — Ambulatory Visit: Payer: PRIVATE HEALTH INSURANCE | Admitting: Family Medicine

## 2020-01-09 ENCOUNTER — Other Ambulatory Visit: Payer: Self-pay

## 2020-01-09 ENCOUNTER — Encounter: Payer: Self-pay | Admitting: Family Medicine

## 2020-01-09 VITALS — BP 120/78 | HR 74 | Temp 97.9°F | Wt 165.0 lb

## 2020-01-09 DIAGNOSIS — R350 Frequency of micturition: Secondary | ICD-10-CM | POA: Diagnosis not present

## 2020-01-09 DIAGNOSIS — T148XXA Other injury of unspecified body region, initial encounter: Secondary | ICD-10-CM

## 2020-01-09 DIAGNOSIS — E782 Mixed hyperlipidemia: Secondary | ICD-10-CM

## 2020-01-09 LAB — POCT URINALYSIS DIPSTICK
Bilirubin, UA: NEGATIVE
Blood, UA: NEGATIVE
Glucose, UA: NEGATIVE
Ketones, UA: NEGATIVE
Leukocytes, UA: NEGATIVE
Nitrite, UA: NEGATIVE
Protein, UA: NEGATIVE
Spec Grav, UA: 1.01 (ref 1.010–1.025)
Urobilinogen, UA: 0.2 E.U./dL
pH, UA: 6 (ref 5.0–8.0)

## 2020-01-09 MED ORDER — SIMVASTATIN 20 MG PO TABS: 20.0000 mg | ORAL_TABLET | Freq: Every day | ORAL | 3 refills | Status: DC

## 2020-01-09 NOTE — Progress Notes (Signed)
Subjective:    Patient ID: Franklin [name], male    DOB: 1956/08/13, 64 y.o.   MRN: [id number]  No chief complaint on file.   HPI Patient was seen today for ongoing concern.  Pt endorses pain between scrotum and rectum after jumping off the last 2 steps of a ladder 2 wks ago. Sitting causes "prostate discomfort".  Pt also notes increased urination and BMs.  Notes smaller caliber BMs.  Denies, hematuria, hematochezia, h/o hemorrhoids.  Last colonoscopy was 3 yrs ago.    Past Medical History:  Diagnosis Date  . GERD (gastroesophageal reflux disease)   . Hyperlipidemia    Umbilical hernia repair Dec 2019. No Known Allergies  ROS General: Denies fever, chills, night sweats, changes in weight, changes in appetite HEENT: Denies headaches, ear pain, changes in vision, rhinorrhea, sore throat CV: Denies CP, palpitations, SOB, orthopnea Pulm: Denies SOB, cough, wheezing GI: Denies abdominal pain, nausea, vomiting, diarrhea, constipation GU: Denies dysuria, hematuria, frequency, vaginal discharge Msk: Denies muscle cramps, joint pains  +prostate/pelvic pain Neuro: Denies weakness, numbness, tingling Skin: Denies rashes, bruising Psych: Denies depression, anxiety, hallucinations    Objective:    Blood pressure 120/78, pulse 74, temperature 97.9 F (36.6 C), temperature source Temporal, weight 165 lb (74.8 kg), SpO2 100 %.  Gen. Pleasant, well-nourished, in no distress, normal affect   HEENT: [place]/AT, face symmetric, conjunctiva clear, no scleral icterus, PERRLA, EOMI, nares patent without drainage, pharynx without erythema or exudate. Neck: No JVD, no thyromegaly, no carotid bruits Lungs: no accessory muscle use, CTAB, no wheezes or rales Cardiovascular: RRR, no peripheral edema GU:  Normal external male genitalia,  No rashes, bulges, or lesions noted.  No anal irritation or hemorrhoids noted.  Prostate smooth and nonboggy, nontender, no masses palpated.  Stool guaiac negative. Neuro:   A&Ox3, CN II-XII intact, normal gait Skin:  Warm, no lesions/ rash   Wt Readings from Last 3 Encounters:  09/04/19 161 lb (73 kg)  11/09/18 165 lb 9.1 oz (75.1 kg)  08/29/18 164 lb (74.4 kg)    Lab Results  Component Value Date   WBC 5.9 09/04/2019   HGB 15.7 09/04/2019   HCT 45.4 09/04/2019   PLT 180.0 09/04/2019   GLUCOSE 111 (H) 09/04/2019   CHOL 137 09/04/2019   TRIG 129.0 09/04/2019   HDL 42.90 09/04/2019   LDLCALC 68 09/04/2019   NA 141 09/04/2019   K 4.7 09/04/2019   CL 104 09/04/2019   CREATININE 0.83 09/04/2019   BUN 13 09/04/2019   CO2 29 09/04/2019   PSA 1.65 09/04/2019   HGBA1C 5.8 09/04/2019    Assessment/Plan:  Muscle strain -likely pelvic floor strain -discussed supportive care: -will monitor -f/u in 1-2 wks.  For continued symptoms consider imaging and referral to Urology  Urinary frequency  -discussed possible causes including UTI, BPH ,prostatitis -exam reassuring.  Guaiac negative. -Last PSA 1.65 on 09/04/2019 -given handout - Plan: POCT urinalysis dipstick  F/u in 1-2 wks  Grier Mitts, MD

## 2020-01-09 NOTE — Patient Instructions (Signed)

## 2020-01-09 NOTE — Addendum Note (Signed)
Addended by: Wyvonne Lenz on: 01/09/2020 03:32 PM   Modules accepted: Orders

## 2020-01-28 ENCOUNTER — Ambulatory Visit: Payer: PRIVATE HEALTH INSURANCE | Attending: Internal Medicine

## 2020-01-28 DIAGNOSIS — Z23 Encounter for immunization: Secondary | ICD-10-CM

## 2020-01-28 NOTE — Progress Notes (Signed)
   Covid-19 Vaccination Clinic  Name:  Franklin [name]    MRN: [id number] DOB: 1956-05-04  01/28/2020  [name] was observed post Covid-19 immunization for 15 minutes without incident. He was provided with Vaccine Information Sheet and instruction to access the V-Safe system.   [name] was instructed to call 911 with any severe reactions post vaccine: Marland Kitchen Difficulty breathing  . Swelling of face and throat  . A fast heartbeat  . A bad rash all over body  . Dizziness and weakness   Immunizations Administered    Name Date Dose VIS Date Route   Pfizer COVID-19 Vaccine 01/28/2020 10:08 AM 0.3 mL 11/08/2019 Intramuscular   Manufacturer: [place]   Lot: [id number]   [place]: [id number]

## 2020-10-07 ENCOUNTER — Other Ambulatory Visit: Payer: Self-pay

## 2020-10-07 ENCOUNTER — Encounter: Payer: Self-pay | Admitting: Family Medicine

## 2020-10-07 ENCOUNTER — Ambulatory Visit (INDEPENDENT_AMBULATORY_CARE_PROVIDER_SITE_OTHER): Payer: PRIVATE HEALTH INSURANCE | Admitting: Family Medicine

## 2020-10-07 VITALS — BP 110/80 | HR 77 | Temp 97.9°F | Wt 157.8 lb

## 2020-10-07 DIAGNOSIS — E782 Mixed hyperlipidemia: Secondary | ICD-10-CM

## 2020-10-07 DIAGNOSIS — Z125 Encounter for screening for malignant neoplasm of prostate: Secondary | ICD-10-CM

## 2020-10-07 DIAGNOSIS — Z0000 Encounter for general adult medical examination without abnormal findings: Secondary | ICD-10-CM

## 2020-10-07 DIAGNOSIS — Z8249 Family history of ischemic heart disease and other diseases of the circulatory system: Secondary | ICD-10-CM | POA: Diagnosis not present

## 2020-10-07 NOTE — Progress Notes (Addendum)
Subjective:     Franklin [name] is a 64 y.o. male and is here for a comprehensive physical exam. The patient reports no problems.  Patient states he is doing well overall.  Notes improvement in bilateral shoulder pain.  Playing pickle ball in a league until December.  Walking 45 minutes for exercise and eating healthy.  Patient notes family history of high cholesterol in mom.  Patient father and brother had heart attacks and strokes in their 11s and 75s respectively.  Patient's dad also had a history of non-Hodgkin's lymphoma.  In the past patient had calcium scoring done and stress test.  Patient endorses colonoscopy at age 56 with one polyp removed.  Repeat colonoscopy due late 2020 early 2023.  Patient denies myalgias or joint pain on simvastatin 20 mg.  Also taking OTC fish oil tablets twice daily.  Would like medication to be refilled in mid December.  Will be switching to Medicare at that time.  Social History   Socioeconomic History  . Marital status: Married    Spouse name: Not on file  . Number of children: Not on file  . Years of education: Not on file  . Highest education level: Not on file  Occupational History  . Not on file  Tobacco Use  . Smoking status: Never Smoker  . Smokeless tobacco: Never Used  Substance and Sexual Activity  . Alcohol use: Yes    Comment: OCCASIONAL  . Drug use: Never  . Sexual activity: Not on file  Other Topics Concern  . Not on file  Social History Narrative  . Not on file   Social Determinants of Health   Financial Resource Strain:   . Difficulty of Paying Living Expenses: Not on file  Food Insecurity:   . Worried About Charity fundraiser in the Last Year: Not on file  . Ran Out of Food in the Last Year: Not on file  Transportation Needs:   . Lack of Transportation (Medical): Not on file  . Lack of Transportation (Non-Medical): Not on file  Physical Activity:   . Days of Exercise per Week: Not on file  . Minutes of Exercise per Session:  Not on file  Stress:   . Feeling of Stress : Not on file  Social Connections:   . Frequency of Communication with Friends and Family: Not on file  . Frequency of Social Gatherings with Friends and Family: Not on file  . Attends Religious Services: Not on file  . Active Member of Clubs or Organizations: Not on file  . Attends Archivist Meetings: Not on file  . Marital Status: Not on file  Intimate Partner Violence:   . Fear of Current or Ex-Partner: Not on file  . Emotionally Abused: Not on file  . Physically Abused: Not on file  . Sexually Abused: Not on file   Health Maintenance  Topic Date Due  . Hepatitis C Screening  Never done  . HIV Screening  Never done  . TETANUS/TDAP  Never done  . COLONOSCOPY  Never done  . INFLUENZA VACCINE  Completed  . COVID-19 Vaccine  Completed    The following portions of the patient's history were reviewed and updated as appropriate: allergies, current medications, past family history, past medical history, past social history, past surgical history and problem list.  Review of Systems Pertinent items noted in HPI and remainder of comprehensive ROS otherwise negative.   Objective:    BP 110/80 (BP Location: Left Arm, Patient  Position: Sitting, Cuff Size: Normal)   Pulse 77   Temp 97.9 F (36.6 C) (Oral)   Wt 157 lb 12.8 oz (71.6 kg)   SpO2 99%   BMI 25.47 kg/m  General appearance: alert, cooperative and no distress Head: Normocephalic, without obvious abnormality, atraumatic Eyes: conjunctivae/corneas clear. PERRL, EOM's intact. Fundi benign. Ears: normal TM's and external ear canals both ears Nose: Nares normal. Septum midline. Mucosa normal. No drainage or sinus tenderness. Throat: lips, mucosa, and tongue normal; teeth and gums normal Neck: no adenopathy, no carotid bruit, no JVD, supple, symmetrical, trachea midline and thyroid not enlarged, symmetric, no tenderness/mass/nodules Lungs: clear to auscultation  bilaterally Heart: regular rate and rhythm, S1, S2 normal, no murmur, click, rub or gallop Abdomen: soft, non-tender; bowel sounds normal; no masses,  no organomegaly Extremities: extremities normal, atraumatic, no cyanosis or edema Pulses: 2+ and symmetric Skin: Skin color, texture, turgor normal. No rashes or lesions Lymph nodes: Cervical, supraclavicular, and axillary nodes normal. Neurologic: Alert and oriented X 3, normal strength and tone. Normal symmetric reflexes. Normal coordination and gait    Assessment:    Healthy male exam.      Plan:     Anticipatory guidance given including wearing seatbelts, smoke detectors in the home, increasing physical activity, increasing p.o. intake of water and vegetables. -We will obtain labs -Colonoscopy done at age 44, repeat due late 2022 or early 2023 -Given information about immunizations -Given handout -Next CPE in 1 year See After Visit Summary for Counseling Recommendations    Mixed hyperlipidemia -Continue atorvastatin 20 mg daily -Continue aspirin 160 mg daily - Plan: CBC with Differential/Platelet, Lipid pane  Prostate cancer screening - Plan: PSA  Family history of cardiovascular disease -Continue statin and ASA -Continue lifestyle modifications  Follow-up as needed  Grier Mitts, MD

## 2020-10-07 NOTE — Patient Instructions (Signed)
Preventive Care 41-65 Years Old, Male Preventive care refers to lifestyle choices and visits with your health care provider that can promote health and wellness. This includes:  A yearly physical exam. This is also called an annual well check.  Regular dental and eye exams.  Immunizations.  Screening for certain conditions.  Healthy lifestyle choices, such as eating a healthy diet, getting regular exercise, not using drugs or products that contain nicotine and tobacco, and limiting alcohol use. What can I expect for my preventive care visit? Physical exam Your health care provider will check:  Height and weight. These may be used to calculate body mass index (BMI), which is a measurement that tells if you are at a healthy weight.  Heart rate and blood pressure.  Your skin for abnormal spots. Counseling Your health care provider may ask you questions about:  Alcohol, tobacco, and drug use.  Emotional well-being.  Home and relationship well-being.  Sexual activity.  Eating habits.  Work and work Statistician. What immunizations do I need?  Influenza (flu) vaccine  This is recommended every year. Tetanus, diphtheria, and pertussis (Tdap) vaccine  You may need a Td booster every 10 years. Varicella (chickenpox) vaccine  You may need this vaccine if you have not already been vaccinated. Zoster (shingles) vaccine  You may need this after age 64. Measles, mumps, and rubella (MMR) vaccine  You may need at least one dose of MMR if you were born in 1957 or later. You may also need a second dose. Pneumococcal conjugate (PCV13) vaccine  You may need this if you have certain conditions and were not previously vaccinated. Pneumococcal polysaccharide (PPSV23) vaccine  You may need one or two doses if you smoke cigarettes or if you have certain conditions. Meningococcal conjugate (MenACWY) vaccine  You may need this if you have certain conditions. Hepatitis A  vaccine  You may need this if you have certain conditions or if you travel or work in places where you may be exposed to hepatitis A. Hepatitis B vaccine  You may need this if you have certain conditions or if you travel or work in places where you may be exposed to hepatitis B. Haemophilus influenzae type b (Hib) vaccine  You may need this if you have certain risk factors. Human papillomavirus (HPV) vaccine  If recommended by your health care provider, you may need three doses over 6 months. You may receive vaccines as individual doses or as more than one vaccine together in one shot (combination vaccines). Talk with your health care provider about the risks and benefits of combination vaccines. What tests do I need? Blood tests  Lipid and cholesterol levels. These may be checked every 5 years, or more frequently if you are over 60 years old.  Hepatitis C test.  Hepatitis B test. Screening  Lung cancer screening. You may have this screening every year starting at age 43 if you have a 30-pack-year history of smoking and currently smoke or have quit within the past 15 years.  Prostate cancer screening. Recommendations will vary depending on your family history and other risks.  Colorectal cancer screening. All adults should have this screening starting at age 72 and continuing until age 2. Your health care provider may recommend screening at age 14 if you are at increased risk. You will have tests every 1-10 years, depending on your results and the type of screening test.  Diabetes screening. This is done by checking your blood sugar (glucose) after you have not eaten  for a while (fasting). You may have this done every 1-3 years.  Sexually transmitted disease (STD) testing. Follow these instructions at home: Eating and drinking  Eat a diet that includes fresh fruits and vegetables, whole grains, lean protein, and low-fat dairy products.  Take vitamin and mineral supplements as  recommended by your health care provider.  Do not drink alcohol if your health care provider tells you not to drink.  If you drink alcohol: ? Limit how much you have to 0-2 drinks a day. ? Be aware of how much alcohol is in your drink. In the [place] one drink equals one 12 oz bottle of beer (355 mL), one 5 oz glass of wine (148 mL), or one 1 oz glass of hard liquor (44 mL). Lifestyle  Take daily care of your teeth and gums.  Stay active. Exercise for at least 30 minutes on 5 or more days each week.  Do not use any products that contain nicotine or tobacco, such as cigarettes, e-cigarettes, and chewing tobacco. If you need help quitting, ask your health care provider.  If you are sexually active, practice safe sex. Use a condom or other form of protection to prevent STIs (sexually transmitted infections).  Talk with your health care provider about taking a low-dose aspirin every day starting at age 71. What's next?  Go to your health care provider once a year for a well check visit.  Ask your health care provider how often you should have your eyes and teeth checked.  Stay up to date on all vaccines. This information is not intended to replace advice given to you by your health care provider. Make sure you discuss any questions you have with your health care provider. Document Revised: 11/08/2018 Document Reviewed: 11/08/2018 Elsevier Patient Education  [name].  High Cholesterol  High cholesterol is a condition in which the blood has high levels of a white, waxy, fat-like substance (cholesterol). The human body needs small amounts of cholesterol. The liver makes all the cholesterol that the body needs. Extra (excess) cholesterol comes from the food that we eat. Cholesterol is carried from the liver by the blood through the blood vessels. If you have high cholesterol, deposits (plaques) may build up on the walls of your blood vessels (arteries). Plaques make the arteries  narrower and stiffer. Cholesterol plaques increase your risk for heart attack and stroke. Work with your health care provider to keep your cholesterol levels in a healthy range. What increases the risk? This condition is more likely to develop in people who:  Eat foods that are high in animal fat (saturated fat) or cholesterol.  Are overweight.  Are not getting enough exercise.  Have a family history of high cholesterol. What are the signs or symptoms? There are no symptoms of this condition. How is this diagnosed? This condition may be diagnosed from the results of a blood test.  If you are older than age 70, your health care provider may check your cholesterol every 4-6 years.  You may be checked more often if you already have high cholesterol or other risk factors for heart disease. The blood test for cholesterol measures:  "Bad" cholesterol (LDL cholesterol). This is the main type of cholesterol that causes heart disease. The desired level for LDL is less than 100.  "Good" cholesterol (HDL cholesterol). This type helps to protect against heart disease by cleaning the arteries and carrying the LDL away. The desired level for HDL is 60 or higher.  Triglycerides. These are fats that the body can store or burn for energy. The desired number for triglycerides is lower than 150.  Total cholesterol. This is a measure of the total amount of cholesterol in your blood, including LDL cholesterol, HDL cholesterol, and triglycerides. A healthy number is less than 200. How is this treated? This condition is treated with diet changes, lifestyle changes, and medicines. Diet changes  This may include eating more whole grains, fruits, vegetables, nuts, and fish.  This may also include cutting back on red meat and foods that have a lot of added sugar. Lifestyle changes  Changes may include getting at least 40 minutes of aerobic exercise 3 times a week. Aerobic exercises include walking, biking,  and swimming. Aerobic exercise along with a healthy diet can help you maintain a healthy weight.  Changes may also include quitting smoking. Medicines  Medicines are usually given if diet and lifestyle changes have failed to reduce your cholesterol to healthy levels.  Your health care provider may prescribe a statin medicine. Statin medicines have been shown to reduce cholesterol, which can reduce the risk of heart disease. Follow these instructions at home: Eating and drinking If told by your health care provider:  Eat chicken (without skin), fish, veal, shellfish, ground [place] breast, and round or loin cuts of red meat.  Do not eat fried foods or fatty meats, such as hot dogs and salami.  Eat plenty of fruits, such as apples.  Eat plenty of vegetables, such as broccoli, potatoes, and carrots.  Eat beans, peas, and lentils.  Eat grains such as barley, rice, couscous, and bulgur wheat.  Eat pasta without cream sauces.  Use skim or nonfat milk, and eat low-fat or nonfat yogurt and cheeses.  Do not eat or drink whole milk, cream, ice cream, egg yolks, or hard cheeses.  Do not eat stick margarine or tub margarines that contain trans fats (also called partially hydrogenated oils).  Do not eat saturated tropical oils, such as coconut oil and palm oil.  Do not eat cakes, cookies, crackers, or other baked goods that contain trans fats.  General instructions  Exercise as directed by your health care provider. Increase your activity level with activities such as gardening, walking, and taking the stairs.  Take over-the-counter and prescription medicines only as told by your health care provider.  Do not use any products that contain nicotine or tobacco, such as cigarettes and e-cigarettes. If you need help quitting, ask your health care provider.  Keep all follow-up visits as told by your health care provider. This is important. Contact a health care provider if:  You are  struggling to maintain a healthy diet or weight.  You need help to start on an exercise program.  You need help to stop smoking. Get help right away if:  You have chest pain.  You have trouble breathing. This information is not intended to replace advice given to you by your health care provider. Make sure you discuss any questions you have with your health care provider. Document Revised: 11/17/2017 Document Reviewed: 05/14/2016 Elsevier Patient Education  [place].  Immunization Schedule, 75-44 Years Old  Vaccines are usually given at various ages, according to a schedule. Your health care provider will recommend vaccines for you based on your age, medical history, and lifestyle or other factors such as travel or where you work. You may receive vaccines as individual doses or as more than one vaccine together in one shot (combination vaccines). Talk  with your health care provider about the risks and benefits of combination vaccines. Recommended immunizations for 39-60 years old Influenza vaccine  You should get a dose of the influenza vaccine every year. Tetanus, diphtheria, and pertussis vaccine A vaccine that protects against tetanus, diphtheria, and pertussis is known as the Tdap vaccine. A vaccine that protects against tetanus and diphtheria is known as the Td vaccine.  You should only get the Td vaccine if you have had at least 1 dose of the Tdap vaccine.  You should get 1 dose of the Td or Tdap vaccine every 10 years, or you should get 1 dose of the Tdap vaccine if: ? You have not previously gotten a Tdap vaccine. ? You do not know if you have ever gotten a Tdap vaccine. Zoster vaccine This is also known as the RZV vaccine. You should get 2 doses of the RZV vaccine 2 to 6 months apart. It is important to get the RZV vaccine even if you:  Have had shingles.  Have received the ZVL vaccine, an older version of the RZV vaccine.  Are unsure if you have had chickenpox  (varicella). Pneumococcal conjugate vaccine This is also known as the PCV13 vaccine. You should get the PCV13 vaccine as recommended if you have certain high-risk conditions. These include:  Diabetes.  Chronic conditions of the heart, lungs, or liver.  Conditions that affect the body's disease-fighting system (immune system). Pneumococcal polysaccharide vaccine This is also known as the PPSV23 vaccine. You should get the PPSV23 vaccine as recommended if you have certain high-risk conditions. These include:  Diabetes.  Chronic conditions of the heart, lungs, or liver.  Conditions that affect the immune system. Hepatitis A vaccine This is also known as the HepA vaccine. If you did not get the HepA vaccine previously, you should get it if:  You are at risk for a hepatitis A infection. You may be at risk for infection if you: ? Have chronic liver disease. ? Have HIV or AIDS. ? Are a man who has sex with men. ? Use drugs. ? Are homeless. ? May be exposed to hepatitis A through work. ? Travel to countries where hepatitis A is common. ? Have or will have close contact with someone who was adopted from another country.  You are not at risk for infection but want protection from hepatitis A. Hepatitis B vaccine This is also known as the HepB vaccine. If you did not get the HepB vaccine previously, you should get it if:  You are at risk for hepatitis B infection. You are at risk if you: ? Have chronic liver disease. ? Have HIV or AIDS. ? Have sex with a partner who has hepatitis B, or:  You have multiple sex partners.  You are a man who has sex with men. ? Use drugs. ? May be exposed to hepatitis B through work. ? Live with someone who has hepatitis B. ? Receive dialysis treatment. ? Have diabetes. ? Travel to countries where hepatitis B is common.  You are not at risk of infection but want protection from hepatitis B. Measles, mumps, and rubella vaccine This is also known as  the MMR vaccine. You may need to get the MMR vaccine if you were born in 1957 or later and:  You need to catch up on doses you missed in the past.  You have not been given the vaccine before.  You do not have evidence of immunity (by a blood test). Varicella vaccine  This is also known as the VAR vaccine. You may need to get the VAR vaccine if you do not have evidence of immunity (by a blood test) and you may be exposed to varicella through work. This is especially important if you work in a health care setting. Meningococcal conjugate vaccine This is also known as the MenACWY vaccine. You may need to get the MenACWY vaccine if you:  Have not been given the vaccine before.  Need to catch up on doses you missed in the past. This vaccine is especially important if you:  Do not have a spleen.  Have sickle cell disease.  Have HIV.  Take medicines that suppress your immune system.  Travel to countries where meningococcal disease is common.  Are exposed to Neisseria meningitidis at work. Serogroup B meningococcal vaccine This is also known as the MenB vaccine. You may need to get the MenB vaccine if you:  Have not been given the vaccine before.  Need to catch up on doses you missed in the past. This vaccine is especially important if you:  Do not have a spleen.  Have sickle cell disease.  Take medicines that suppress your immune system.  Are exposed to Neisseria meningitidis at work. Haemophilus influenzae type b vaccine This is also known as the Hib vaccine. Anyone older than 65 years of age is usually not given the Hib vaccine. However, if you have certain high-risk conditions, you may need to get this vaccine. These conditions include:  Not having a spleen.  Having received a stem cell transplant. Before you get a vaccine: Talk with your health care provider about which vaccines are right for you. This is especially important if:  You previously had a reaction after  getting a vaccine.  You have a weakened immune system. You may have a weakened immune system if you: ? Are taking medicines that reduce (suppress) the activity of your immune system. ? Are taking medicines to treat cancer (chemotherapy). ? Have HIV or AIDS.  You work in an environment where you may be exposed to a disease.  You plan to travel outside of the country.  You have a chronic illness, such as heart disease, kidney disease, diabetes, or lung disease. Summary  Before you get a vaccine, tell your health care provider if you have reacted to vaccines in the past or have a condition that weakens your immune system.  At 50-64 years, you should get a dose of the flu vaccine every year and a dose of the Td or Tdap vaccine every 10 years.  You should get 2 doses of the RZV vaccine 2 to 6 months apart.  Depending on your medical history and your risk factors, you may need other vaccines. Ask your health care provider whether you are up to date on all your vaccines. This information is not intended to replace advice given to you by your health care provider. Make sure you discuss any questions you have with your health care provider. Document Revised: 09/10/2019 Document Reviewed: 09/10/2019 Elsevier Patient Education  [place].

## 2020-10-08 ENCOUNTER — Encounter: Payer: Self-pay | Admitting: Family Medicine

## 2020-10-08 ENCOUNTER — Other Ambulatory Visit: Payer: Self-pay

## 2020-10-08 DIAGNOSIS — E782 Mixed hyperlipidemia: Secondary | ICD-10-CM

## 2020-10-08 LAB — CBC WITH DIFFERENTIAL/PLATELET
Absolute Monocytes: 424 cells/uL (ref 200–950)
Basophils Absolute: 39 cells/uL (ref 0–200)
Basophils Relative: 0.7 %
Eosinophils Absolute: 198 cells/uL (ref 15–500)
Eosinophils Relative: 3.6 %
HCT: 46.1 % (ref 38.5–50.0)
Hemoglobin: 15.8 g/dL (ref 13.2–17.1)
Lymphs Abs: 1353 cells/uL (ref 850–3900)
MCH: 33.5 pg — ABNORMAL HIGH (ref 27.0–33.0)
MCHC: 34.3 g/dL (ref 32.0–36.0)
MCV: 97.9 fL (ref 80.0–100.0)
MPV: 10.4 fL (ref 7.5–12.5)
Monocytes Relative: 7.7 %
Neutro Abs: 3487 cells/uL (ref 1500–7800)
Neutrophils Relative %: 63.4 %
Platelets: 179 10*3/uL (ref 140–400)
RBC: 4.71 10*6/uL (ref 4.20–5.80)
RDW: 11.8 % (ref 11.0–15.0)
Total Lymphocyte: 24.6 %
WBC: 5.5 10*3/uL (ref 3.8–10.8)

## 2020-10-08 LAB — T4, FREE: Free T4: 1.1 ng/dL (ref 0.8–1.8)

## 2020-10-08 LAB — LIPID PANEL
Cholesterol: 160 mg/dL (ref <200)
HDL: 56 mg/dL (ref >=40)
LDL Cholesterol (Calc): 84 mg/dL (calc)
Non-HDL Cholesterol (Calc): 104 mg/dL (calc) (ref <130)
Total CHOL/HDL Ratio: 2.9 (calc) (ref <5.0)
Triglycerides: 102 mg/dL (ref <150)

## 2020-10-08 LAB — COMPLETE METABOLIC PANEL WITH GFR
AG Ratio: 1.8 (calc) (ref 1.0–2.5)
ALT: 14 U/L (ref 9–46)
AST: 19 U/L (ref 10–35)
Albumin: 4.8 g/dL (ref 3.6–5.1)
Alkaline phosphatase (APISO): 76 U/L (ref 35–144)
BUN: 16 mg/dL (ref 7–25)
CO2: 29 mmol/L (ref 20–32)
Calcium: 9.6 mg/dL (ref 8.6–10.3)
Chloride: 103 mmol/L (ref 98–110)
Creat: 0.81 mg/dL (ref 0.70–1.25)
GFR, Est African American: 109 mL/min/{1.73_m2} (ref >=60)
GFR, Est Non African American: 94 mL/min/{1.73_m2} (ref >=60)
Globulin: 2.6 g/dL (calc) (ref 1.9–3.7)
Glucose, Bld: 106 mg/dL — ABNORMAL HIGH (ref 65–99)
Potassium: 4.4 mmol/L (ref 3.5–5.3)
Sodium: 139 mmol/L (ref 135–146)
Total Bilirubin: 1.1 mg/dL (ref 0.2–1.2)
Total Protein: 7.4 g/dL (ref 6.1–8.1)

## 2020-10-08 LAB — HEMOGLOBIN A1C
Hgb A1c MFr Bld: 5.7 % of total Hgb — ABNORMAL HIGH (ref <5.7)
Mean Plasma Glucose: 117 (calc)
eAG (mmol/L): 6.5 (calc)

## 2020-10-08 LAB — PSA: PSA: 1.6 ng/mL (ref <=4.0)

## 2020-10-08 LAB — TSH: TSH: 1.58 mIU/L (ref 0.40–4.50)

## 2020-10-08 MED ORDER — SIMVASTATIN 20 MG PO TABS: 20.0000 mg | ORAL_TABLET | Freq: Every day | ORAL | 3 refills | Status: DC

## 2020-12-01 DIAGNOSIS — J3089 Other allergic rhinitis: Secondary | ICD-10-CM | POA: Diagnosis not present

## 2020-12-01 DIAGNOSIS — J301 Allergic rhinitis due to pollen: Secondary | ICD-10-CM | POA: Diagnosis not present

## 2020-12-01 DIAGNOSIS — J3081 Allergic rhinitis due to animal (cat) (dog) hair and dander: Secondary | ICD-10-CM | POA: Diagnosis not present

## 2020-12-16 DIAGNOSIS — J3081 Allergic rhinitis due to animal (cat) (dog) hair and dander: Secondary | ICD-10-CM | POA: Diagnosis not present

## 2020-12-16 DIAGNOSIS — J3089 Other allergic rhinitis: Secondary | ICD-10-CM | POA: Diagnosis not present

## 2020-12-16 DIAGNOSIS — J301 Allergic rhinitis due to pollen: Secondary | ICD-10-CM | POA: Diagnosis not present

## 2020-12-22 DIAGNOSIS — J3081 Allergic rhinitis due to animal (cat) (dog) hair and dander: Secondary | ICD-10-CM | POA: Diagnosis not present

## 2020-12-22 DIAGNOSIS — J301 Allergic rhinitis due to pollen: Secondary | ICD-10-CM | POA: Diagnosis not present

## 2020-12-22 DIAGNOSIS — J3089 Other allergic rhinitis: Secondary | ICD-10-CM | POA: Diagnosis not present

## 2020-12-22 DIAGNOSIS — J339 Nasal polyp, unspecified: Secondary | ICD-10-CM | POA: Diagnosis not present

## 2020-12-23 ENCOUNTER — Encounter: Payer: Self-pay | Admitting: Family Medicine

## 2020-12-25 ENCOUNTER — Other Ambulatory Visit: Payer: Self-pay

## 2020-12-25 DIAGNOSIS — E782 Mixed hyperlipidemia: Secondary | ICD-10-CM

## 2020-12-25 MED ORDER — SIMVASTATIN 20 MG PO TABS: 20.0000 mg | ORAL_TABLET | Freq: Every day | ORAL | 3 refills | Status: DC

## 2020-12-29 DIAGNOSIS — J3081 Allergic rhinitis due to animal (cat) (dog) hair and dander: Secondary | ICD-10-CM | POA: Diagnosis not present

## 2020-12-29 DIAGNOSIS — J301 Allergic rhinitis due to pollen: Secondary | ICD-10-CM | POA: Diagnosis not present

## 2020-12-29 DIAGNOSIS — J3089 Other allergic rhinitis: Secondary | ICD-10-CM | POA: Diagnosis not present

## 2021-01-04 DIAGNOSIS — J3081 Allergic rhinitis due to animal (cat) (dog) hair and dander: Secondary | ICD-10-CM | POA: Diagnosis not present

## 2021-01-04 DIAGNOSIS — J3089 Other allergic rhinitis: Secondary | ICD-10-CM | POA: Diagnosis not present

## 2021-01-04 DIAGNOSIS — J301 Allergic rhinitis due to pollen: Secondary | ICD-10-CM | POA: Diagnosis not present

## 2021-01-06 DIAGNOSIS — J301 Allergic rhinitis due to pollen: Secondary | ICD-10-CM | POA: Diagnosis not present

## 2021-01-06 DIAGNOSIS — J3081 Allergic rhinitis due to animal (cat) (dog) hair and dander: Secondary | ICD-10-CM | POA: Diagnosis not present

## 2021-01-06 DIAGNOSIS — J3089 Other allergic rhinitis: Secondary | ICD-10-CM | POA: Diagnosis not present

## 2021-01-11 DIAGNOSIS — J3081 Allergic rhinitis due to animal (cat) (dog) hair and dander: Secondary | ICD-10-CM | POA: Diagnosis not present

## 2021-01-11 DIAGNOSIS — J301 Allergic rhinitis due to pollen: Secondary | ICD-10-CM | POA: Diagnosis not present

## 2021-01-11 DIAGNOSIS — J3089 Other allergic rhinitis: Secondary | ICD-10-CM | POA: Diagnosis not present

## 2021-01-13 DIAGNOSIS — J3081 Allergic rhinitis due to animal (cat) (dog) hair and dander: Secondary | ICD-10-CM | POA: Diagnosis not present

## 2021-01-13 DIAGNOSIS — J301 Allergic rhinitis due to pollen: Secondary | ICD-10-CM | POA: Diagnosis not present

## 2021-01-13 DIAGNOSIS — J3089 Other allergic rhinitis: Secondary | ICD-10-CM | POA: Diagnosis not present

## 2021-01-18 DIAGNOSIS — J3089 Other allergic rhinitis: Secondary | ICD-10-CM | POA: Diagnosis not present

## 2021-01-18 DIAGNOSIS — J301 Allergic rhinitis due to pollen: Secondary | ICD-10-CM | POA: Diagnosis not present

## 2021-01-18 DIAGNOSIS — J3081 Allergic rhinitis due to animal (cat) (dog) hair and dander: Secondary | ICD-10-CM | POA: Diagnosis not present

## 2021-01-29 DIAGNOSIS — J3081 Allergic rhinitis due to animal (cat) (dog) hair and dander: Secondary | ICD-10-CM | POA: Diagnosis not present

## 2021-01-29 DIAGNOSIS — J301 Allergic rhinitis due to pollen: Secondary | ICD-10-CM | POA: Diagnosis not present

## 2021-01-29 DIAGNOSIS — J3089 Other allergic rhinitis: Secondary | ICD-10-CM | POA: Diagnosis not present

## 2021-02-12 DIAGNOSIS — J301 Allergic rhinitis due to pollen: Secondary | ICD-10-CM | POA: Diagnosis not present

## 2021-02-12 DIAGNOSIS — J3081 Allergic rhinitis due to animal (cat) (dog) hair and dander: Secondary | ICD-10-CM | POA: Diagnosis not present

## 2021-02-12 DIAGNOSIS — J3089 Other allergic rhinitis: Secondary | ICD-10-CM | POA: Diagnosis not present

## 2021-02-25 DIAGNOSIS — J301 Allergic rhinitis due to pollen: Secondary | ICD-10-CM | POA: Diagnosis not present

## 2021-02-25 DIAGNOSIS — J3089 Other allergic rhinitis: Secondary | ICD-10-CM | POA: Diagnosis not present

## 2021-02-25 DIAGNOSIS — J3081 Allergic rhinitis due to animal (cat) (dog) hair and dander: Secondary | ICD-10-CM | POA: Diagnosis not present

## 2021-03-01 DIAGNOSIS — H5213 Myopia, bilateral: Secondary | ICD-10-CM | POA: Diagnosis not present

## 2021-03-10 DIAGNOSIS — J3081 Allergic rhinitis due to animal (cat) (dog) hair and dander: Secondary | ICD-10-CM | POA: Diagnosis not present

## 2021-03-10 DIAGNOSIS — J301 Allergic rhinitis due to pollen: Secondary | ICD-10-CM | POA: Diagnosis not present

## 2021-03-10 DIAGNOSIS — J3089 Other allergic rhinitis: Secondary | ICD-10-CM | POA: Diagnosis not present

## 2021-03-22 DIAGNOSIS — J3081 Allergic rhinitis due to animal (cat) (dog) hair and dander: Secondary | ICD-10-CM | POA: Diagnosis not present

## 2021-03-22 DIAGNOSIS — J3089 Other allergic rhinitis: Secondary | ICD-10-CM | POA: Diagnosis not present

## 2021-03-22 DIAGNOSIS — J301 Allergic rhinitis due to pollen: Secondary | ICD-10-CM | POA: Diagnosis not present

## 2021-04-06 DIAGNOSIS — J301 Allergic rhinitis due to pollen: Secondary | ICD-10-CM | POA: Diagnosis not present

## 2021-04-06 DIAGNOSIS — J3081 Allergic rhinitis due to animal (cat) (dog) hair and dander: Secondary | ICD-10-CM | POA: Diagnosis not present

## 2021-04-06 DIAGNOSIS — J3089 Other allergic rhinitis: Secondary | ICD-10-CM | POA: Diagnosis not present

## 2021-04-16 DIAGNOSIS — J3081 Allergic rhinitis due to animal (cat) (dog) hair and dander: Secondary | ICD-10-CM | POA: Diagnosis not present

## 2021-04-16 DIAGNOSIS — J301 Allergic rhinitis due to pollen: Secondary | ICD-10-CM | POA: Diagnosis not present

## 2021-04-16 DIAGNOSIS — J3089 Other allergic rhinitis: Secondary | ICD-10-CM | POA: Diagnosis not present

## 2021-05-05 DIAGNOSIS — J3089 Other allergic rhinitis: Secondary | ICD-10-CM | POA: Diagnosis not present

## 2021-05-05 DIAGNOSIS — J301 Allergic rhinitis due to pollen: Secondary | ICD-10-CM | POA: Diagnosis not present

## 2021-05-05 DIAGNOSIS — J3081 Allergic rhinitis due to animal (cat) (dog) hair and dander: Secondary | ICD-10-CM | POA: Diagnosis not present

## 2021-05-19 DIAGNOSIS — J301 Allergic rhinitis due to pollen: Secondary | ICD-10-CM | POA: Diagnosis not present

## 2021-05-19 DIAGNOSIS — J3081 Allergic rhinitis due to animal (cat) (dog) hair and dander: Secondary | ICD-10-CM | POA: Diagnosis not present

## 2021-05-19 DIAGNOSIS — J3089 Other allergic rhinitis: Secondary | ICD-10-CM | POA: Diagnosis not present

## 2021-06-02 DIAGNOSIS — J301 Allergic rhinitis due to pollen: Secondary | ICD-10-CM | POA: Diagnosis not present

## 2021-06-02 DIAGNOSIS — J3089 Other allergic rhinitis: Secondary | ICD-10-CM | POA: Diagnosis not present

## 2021-06-02 DIAGNOSIS — J3081 Allergic rhinitis due to animal (cat) (dog) hair and dander: Secondary | ICD-10-CM | POA: Diagnosis not present

## 2021-06-11 ENCOUNTER — Encounter: Payer: Self-pay | Admitting: Family Medicine

## 2021-06-11 DIAGNOSIS — Z1211 Encounter for screening for malignant neoplasm of colon: Secondary | ICD-10-CM

## 2021-06-11 DIAGNOSIS — Z125 Encounter for screening for malignant neoplasm of prostate: Secondary | ICD-10-CM

## 2021-06-16 DIAGNOSIS — J3081 Allergic rhinitis due to animal (cat) (dog) hair and dander: Secondary | ICD-10-CM | POA: Diagnosis not present

## 2021-06-16 DIAGNOSIS — J301 Allergic rhinitis due to pollen: Secondary | ICD-10-CM | POA: Diagnosis not present

## 2021-06-16 DIAGNOSIS — J3089 Other allergic rhinitis: Secondary | ICD-10-CM | POA: Diagnosis not present

## 2021-06-29 DIAGNOSIS — J3081 Allergic rhinitis due to animal (cat) (dog) hair and dander: Secondary | ICD-10-CM | POA: Diagnosis not present

## 2021-06-29 DIAGNOSIS — J301 Allergic rhinitis due to pollen: Secondary | ICD-10-CM | POA: Diagnosis not present

## 2021-06-29 DIAGNOSIS — J3089 Other allergic rhinitis: Secondary | ICD-10-CM | POA: Diagnosis not present

## 2021-07-02 DIAGNOSIS — L814 Other melanin hyperpigmentation: Secondary | ICD-10-CM | POA: Diagnosis not present

## 2021-07-02 DIAGNOSIS — D2262 Melanocytic nevi of left upper limb, including shoulder: Secondary | ICD-10-CM | POA: Diagnosis not present

## 2021-07-02 DIAGNOSIS — K130 Diseases of lips: Secondary | ICD-10-CM | POA: Diagnosis not present

## 2021-07-02 DIAGNOSIS — D2371 Other benign neoplasm of skin of right lower limb, including hip: Secondary | ICD-10-CM | POA: Diagnosis not present

## 2021-07-02 DIAGNOSIS — D1801 Hemangioma of skin and subcutaneous tissue: Secondary | ICD-10-CM | POA: Diagnosis not present

## 2021-07-02 DIAGNOSIS — L821 Other seborrheic keratosis: Secondary | ICD-10-CM | POA: Diagnosis not present

## 2021-07-02 DIAGNOSIS — L578 Other skin changes due to chronic exposure to nonionizing radiation: Secondary | ICD-10-CM | POA: Diagnosis not present

## 2021-07-14 DIAGNOSIS — J3089 Other allergic rhinitis: Secondary | ICD-10-CM | POA: Diagnosis not present

## 2021-07-14 DIAGNOSIS — J3081 Allergic rhinitis due to animal (cat) (dog) hair and dander: Secondary | ICD-10-CM | POA: Diagnosis not present

## 2021-07-14 DIAGNOSIS — J301 Allergic rhinitis due to pollen: Secondary | ICD-10-CM | POA: Diagnosis not present

## 2021-07-27 ENCOUNTER — Telehealth: Payer: Self-pay | Admitting: Gastroenterology

## 2021-07-27 NOTE — Telephone Encounter (Signed)
Hi Dr. Havery Moros, we have received a referral from patient's PCP for a repeat colon. Patient had colon in 2017. He is requesting to become your patient. Records will be sent to you for review. Please advise on scheduling. Thank you.

## 2021-07-27 NOTE — Telephone Encounter (Signed)
I'm happy to see him. Will await records and make decision about timing of next colonoscopy. I don't see any records in my inbox at this time. Thanks

## 2021-07-28 DIAGNOSIS — J3089 Other allergic rhinitis: Secondary | ICD-10-CM | POA: Diagnosis not present

## 2021-07-28 DIAGNOSIS — J3081 Allergic rhinitis due to animal (cat) (dog) hair and dander: Secondary | ICD-10-CM | POA: Diagnosis not present

## 2021-07-28 DIAGNOSIS — J301 Allergic rhinitis due to pollen: Secondary | ICD-10-CM | POA: Diagnosis not present

## 2021-07-29 NOTE — Telephone Encounter (Signed)
Records reviewed.  He had a colonoscopy done 04/29/2016 by Dr. Albin Felling from [facility]. He had a 47m adenoma removed, no other polyps, reported to have an "excellent" prep. At that time in 2017, surveillance guidelines recommended a 5 year follow up for this type / size of polyp. Guidelines have intervally been updated and now recommend a 7 year follow up for this type of polyp. So, I think okay to extend his surveillance and do his exam in 04/2023 and place a recall at that time, unless he feels strongly otherwise he wishes to have it now. Thanks

## 2021-07-30 NOTE — Telephone Encounter (Signed)
Pt informed and agreed with plan. Recall placed for 04/2023.

## 2021-08-11 DIAGNOSIS — J301 Allergic rhinitis due to pollen: Secondary | ICD-10-CM | POA: Diagnosis not present

## 2021-08-11 DIAGNOSIS — J3081 Allergic rhinitis due to animal (cat) (dog) hair and dander: Secondary | ICD-10-CM | POA: Diagnosis not present

## 2021-08-11 DIAGNOSIS — J3089 Other allergic rhinitis: Secondary | ICD-10-CM | POA: Diagnosis not present

## 2021-08-25 DIAGNOSIS — J3089 Other allergic rhinitis: Secondary | ICD-10-CM | POA: Diagnosis not present

## 2021-08-25 DIAGNOSIS — J3081 Allergic rhinitis due to animal (cat) (dog) hair and dander: Secondary | ICD-10-CM | POA: Diagnosis not present

## 2021-08-25 DIAGNOSIS — J301 Allergic rhinitis due to pollen: Secondary | ICD-10-CM | POA: Diagnosis not present

## 2021-09-03 DIAGNOSIS — J3089 Other allergic rhinitis: Secondary | ICD-10-CM | POA: Diagnosis not present

## 2021-09-03 DIAGNOSIS — J301 Allergic rhinitis due to pollen: Secondary | ICD-10-CM | POA: Diagnosis not present

## 2021-09-03 DIAGNOSIS — J3081 Allergic rhinitis due to animal (cat) (dog) hair and dander: Secondary | ICD-10-CM | POA: Diagnosis not present

## 2021-09-15 ENCOUNTER — Encounter: Payer: Self-pay | Admitting: Family Medicine

## 2021-09-15 ENCOUNTER — Telehealth (INDEPENDENT_AMBULATORY_CARE_PROVIDER_SITE_OTHER): Payer: PRIVATE HEALTH INSURANCE | Admitting: Family Medicine

## 2021-09-15 DIAGNOSIS — J309 Allergic rhinitis, unspecified: Secondary | ICD-10-CM | POA: Diagnosis not present

## 2021-09-15 DIAGNOSIS — J3081 Allergic rhinitis due to animal (cat) (dog) hair and dander: Secondary | ICD-10-CM | POA: Diagnosis not present

## 2021-09-15 DIAGNOSIS — J3089 Other allergic rhinitis: Secondary | ICD-10-CM | POA: Diagnosis not present

## 2021-09-15 DIAGNOSIS — J301 Allergic rhinitis due to pollen: Secondary | ICD-10-CM | POA: Diagnosis not present

## 2021-09-15 NOTE — Progress Notes (Signed)
Virtual Visit via Video Note  I connected with Franklin [name] on 09/15/21 at  1:30 PM EDT by a video enabled telemedicine application 2/2 [id number] pandemic and verified that I am speaking with the correct person using two identifiers.  Location patient: home Location provider:work or home office Persons participating in the virtual visit: patient, provider  I discussed the limitations of evaluation and management by telemedicine and the availability of in person appointments. The patient expressed understanding and agreed to proceed.   HPI: Pt states he needs an abx for a sinus infection.  Pt has a h/o allergies, receives allergy shots with Allergist but unable to get in for an appt.  Pt having HA, increased rhinorrhea, yellow mucus, R ear soreness, frontal pressure, cough, chills since 10/15. Subjective fever.  Denies sore throat.  Pt has taken Mucinex DM, nasal lavage, benadryl, tylenol and advil.  In the past used Allegra, then switched to Zyrtec.  Did not want to take Zyrtec now as it dries up his mucus.  ROS: See pertinent positives and negatives per HPI.  Past Medical History:  Diagnosis Date   GERD (gastroesophageal reflux disease)    Hyperlipidemia     Past Surgical History:  Procedure Laterality Date   APPENDECTOMY     CATARACT     SINUS POLLYPS     TONSILLOCTOMY     UMBILICAL HERNIA REPAIR N/A 11/09/2018   Procedure: HERNIA REPAIR UMBILICAL WITH POSSIBLE MESH ERAS PATHWAY;  Surgeon: Judeth Horn, MD;  Location: [facility];  Service: General;  Laterality: N/A;    No family history on file.    Current Outpatient Medications:    BABY ASPIRIN PO, Take 160 mg by mouth daily., Disp: , Rfl:    Coenzyme Q10-Fish Oil-Vit E (CO-Q 10 OMEGA-3 FISH OIL PO), Take 2,000 Units by mouth daily., Disp: , Rfl:    simvastatin (ZOCOR) 20 MG tablet, Take 1 tablet (20 mg total) by mouth daily., Disp: 90 tablet, Rfl: 3   Triamcinolone Acetonide (NASACORT ALLERGY 24HR NA), Place  into the nose as needed., Disp: , Rfl:    oxyCODONE (OXY IR/ROXICODONE) 5 MG immediate release tablet, Take 0.5-1 tablets (2.5-5 mg total) by mouth every 6 (six) hours as needed for severe pain., Disp: 20 tablet, Rfl: 0  EXAM:  VITALS per patient if applicable:RR between 74-12 bpm  GENERAL: alert, oriented, appears well and in no acute distress  HEENT: atraumatic, conjunctiva clear, no obvious abnormalities on inspection of external nose and ears  NECK: normal movements of the head and neck  LUNGS: on inspection no signs of respiratory distress, breathing rate appears normal, no obvious gross SOB, gasping or wheezing  CV: no obvious cyanosis  MS: moves all visible extremities without noticeable abnormality  PSYCH/NEURO: pleasant and cooperative, no obvious depression or anxiety, speech and thought processing grossly intact  ASSESSMENT AND PLAN:  Discussed the following assessment and plan:  Allergic rhinitis, unspecified seasonality, unspecified trigger  Supportive care advised including steam from shower, OTC allergy medications.  Can continue nasal saline rinse.  Also consider Nasacort or Flonase.  Despite current symptoms x 4 days, abx are not indicated.  Advised symptoms can in fact progress into sinusitis over the next few days.  Patient advised to contact clinic on Friday for continued or worsening symptoms.  Patient did not seem happy with this response.  Follow-up as needed.   I discussed the assessment and treatment plan with the patient. The patient was provided an opportunity to ask questions  and all were answered. The patient agreed with the plan and demonstrated an understanding of the instructions.   The patient was advised to call back or seek an in-person evaluation if the symptoms worsen or if the condition fails to improve as anticipated.  Billie Ruddy, MD

## 2021-09-22 ENCOUNTER — Encounter: Payer: Self-pay | Admitting: Family Medicine

## 2021-09-22 MED ORDER — AMOXICILLIN-POT CLAVULANATE 500-125 MG PO TABS: 1.0000 | ORAL_TABLET | Freq: Two times a day (BID) | ORAL | 0 refills | Status: AC

## 2021-09-24 DIAGNOSIS — J301 Allergic rhinitis due to pollen: Secondary | ICD-10-CM | POA: Diagnosis not present

## 2021-09-24 DIAGNOSIS — J3081 Allergic rhinitis due to animal (cat) (dog) hair and dander: Secondary | ICD-10-CM | POA: Diagnosis not present

## 2021-09-24 DIAGNOSIS — J3089 Other allergic rhinitis: Secondary | ICD-10-CM | POA: Diagnosis not present

## 2021-10-01 DIAGNOSIS — J301 Allergic rhinitis due to pollen: Secondary | ICD-10-CM | POA: Diagnosis not present

## 2021-10-01 DIAGNOSIS — J3081 Allergic rhinitis due to animal (cat) (dog) hair and dander: Secondary | ICD-10-CM | POA: Diagnosis not present

## 2021-10-01 DIAGNOSIS — J3089 Other allergic rhinitis: Secondary | ICD-10-CM | POA: Diagnosis not present

## 2021-10-06 DIAGNOSIS — J301 Allergic rhinitis due to pollen: Secondary | ICD-10-CM | POA: Diagnosis not present

## 2021-10-06 DIAGNOSIS — J3089 Other allergic rhinitis: Secondary | ICD-10-CM | POA: Diagnosis not present

## 2021-10-06 DIAGNOSIS — J3081 Allergic rhinitis due to animal (cat) (dog) hair and dander: Secondary | ICD-10-CM | POA: Diagnosis not present

## 2021-10-13 DIAGNOSIS — J3081 Allergic rhinitis due to animal (cat) (dog) hair and dander: Secondary | ICD-10-CM | POA: Diagnosis not present

## 2021-10-13 DIAGNOSIS — J301 Allergic rhinitis due to pollen: Secondary | ICD-10-CM | POA: Diagnosis not present

## 2021-10-13 DIAGNOSIS — J3089 Other allergic rhinitis: Secondary | ICD-10-CM | POA: Diagnosis not present

## 2021-10-19 DIAGNOSIS — J301 Allergic rhinitis due to pollen: Secondary | ICD-10-CM | POA: Diagnosis not present

## 2021-10-19 DIAGNOSIS — J3081 Allergic rhinitis due to animal (cat) (dog) hair and dander: Secondary | ICD-10-CM | POA: Diagnosis not present

## 2021-10-19 DIAGNOSIS — J3089 Other allergic rhinitis: Secondary | ICD-10-CM | POA: Diagnosis not present

## 2021-11-01 DIAGNOSIS — J019 Acute sinusitis, unspecified: Secondary | ICD-10-CM | POA: Diagnosis not present

## 2021-11-01 DIAGNOSIS — B9689 Other specified bacterial agents as the cause of diseases classified elsewhere: Secondary | ICD-10-CM | POA: Diagnosis not present

## 2021-11-02 DIAGNOSIS — J3089 Other allergic rhinitis: Secondary | ICD-10-CM | POA: Diagnosis not present

## 2021-11-02 DIAGNOSIS — J3081 Allergic rhinitis due to animal (cat) (dog) hair and dander: Secondary | ICD-10-CM | POA: Diagnosis not present

## 2021-11-02 DIAGNOSIS — J301 Allergic rhinitis due to pollen: Secondary | ICD-10-CM | POA: Diagnosis not present

## 2021-11-17 DIAGNOSIS — J301 Allergic rhinitis due to pollen: Secondary | ICD-10-CM | POA: Diagnosis not present

## 2021-11-17 DIAGNOSIS — J3081 Allergic rhinitis due to animal (cat) (dog) hair and dander: Secondary | ICD-10-CM | POA: Diagnosis not present

## 2021-11-17 DIAGNOSIS — J3089 Other allergic rhinitis: Secondary | ICD-10-CM | POA: Diagnosis not present

## 2021-12-01 DIAGNOSIS — J301 Allergic rhinitis due to pollen: Secondary | ICD-10-CM | POA: Diagnosis not present

## 2021-12-01 DIAGNOSIS — J3081 Allergic rhinitis due to animal (cat) (dog) hair and dander: Secondary | ICD-10-CM | POA: Diagnosis not present

## 2021-12-01 DIAGNOSIS — J3089 Other allergic rhinitis: Secondary | ICD-10-CM | POA: Diagnosis not present

## 2021-12-06 ENCOUNTER — Encounter: Payer: Self-pay | Admitting: Family Medicine

## 2021-12-06 ENCOUNTER — Ambulatory Visit (INDEPENDENT_AMBULATORY_CARE_PROVIDER_SITE_OTHER): Payer: Medicare HMO | Admitting: Family Medicine

## 2021-12-06 VITALS — BP 90/60 | HR 58 | Temp 98.2°F | Ht 66.0 in | Wt 159.0 lb

## 2021-12-06 DIAGNOSIS — E782 Mixed hyperlipidemia: Secondary | ICD-10-CM | POA: Diagnosis not present

## 2021-12-06 DIAGNOSIS — J342 Deviated nasal septum: Secondary | ICD-10-CM

## 2021-12-06 DIAGNOSIS — Z1159 Encounter for screening for other viral diseases: Secondary | ICD-10-CM

## 2021-12-06 DIAGNOSIS — Z125 Encounter for screening for malignant neoplasm of prostate: Secondary | ICD-10-CM

## 2021-12-06 DIAGNOSIS — Z0000 Encounter for general adult medical examination without abnormal findings: Secondary | ICD-10-CM | POA: Diagnosis not present

## 2021-12-06 DIAGNOSIS — Z131 Encounter for screening for diabetes mellitus: Secondary | ICD-10-CM | POA: Diagnosis not present

## 2021-12-06 LAB — COMPREHENSIVE METABOLIC PANEL
ALT: 16 U/L (ref 0–53)
AST: 22 U/L (ref 0–37)
Albumin: 4.9 g/dL (ref 3.5–5.2)
Alkaline Phosphatase: 67 U/L (ref 39–117)
BUN: 13 mg/dL (ref 6–23)
CO2: 31 mEq/L (ref 19–32)
Calcium: 9.8 mg/dL (ref 8.4–10.5)
Chloride: 102 mEq/L (ref 96–112)
Creatinine, Ser: 0.83 mg/dL (ref 0.40–1.50)
GFR: 91.49 mL/min (ref >60.00)
Glucose, Bld: 109 mg/dL — ABNORMAL HIGH (ref 70–99)
Potassium: 4.7 mEq/L (ref 3.5–5.1)
Sodium: 139 mEq/L (ref 135–145)
Total Bilirubin: 1 mg/dL (ref 0.2–1.2)
Total Protein: 7.6 g/dL (ref 6.0–8.3)

## 2021-12-06 LAB — LIPID PANEL
Cholesterol: 172 mg/dL (ref 0–200)
HDL: 51.9 mg/dL (ref >39.00)
LDL Cholesterol: 85 mg/dL (ref 0–99)
NonHDL: 120.07
Total CHOL/HDL Ratio: 3
Triglycerides: 174 mg/dL — ABNORMAL HIGH (ref 0.0–149.0)
VLDL: 34.8 mg/dL (ref 0.0–40.0)

## 2021-12-06 LAB — CBC WITH DIFFERENTIAL/PLATELET
Basophils Absolute: 0 10*3/uL (ref 0.0–0.1)
Basophils Relative: 0.8 % (ref 0.0–3.0)
Eosinophils Absolute: 0.2 10*3/uL (ref 0.0–0.7)
Eosinophils Relative: 3.1 % (ref 0.0–5.0)
HCT: 46.6 % (ref 39.0–52.0)
Hemoglobin: 15.8 g/dL (ref 13.0–17.0)
Lymphocytes Relative: 22.7 % (ref 12.0–46.0)
Lymphs Abs: 1.3 10*3/uL (ref 0.7–4.0)
MCHC: 33.9 g/dL (ref 30.0–36.0)
MCV: 96.5 fl (ref 78.0–100.0)
Monocytes Absolute: 0.5 10*3/uL (ref 0.1–1.0)
Monocytes Relative: 8.1 % (ref 3.0–12.0)
Neutro Abs: 3.8 10*3/uL (ref 1.4–7.7)
Neutrophils Relative %: 65.3 % (ref 43.0–77.0)
Platelets: 216 10*3/uL (ref 150.0–400.0)
RBC: 4.83 Mil/uL (ref 4.22–5.81)
RDW: 12.9 % (ref 11.5–15.5)
WBC: 5.7 10*3/uL (ref 4.0–10.5)

## 2021-12-06 LAB — TSH: TSH: 2.47 u[IU]/mL (ref 0.35–5.50)

## 2021-12-06 LAB — PSA: PSA: 2.03 ng/mL (ref 0.10–4.00)

## 2021-12-06 LAB — HEMOGLOBIN A1C: Hgb A1c MFr Bld: 6 % (ref 4.6–6.5)

## 2021-12-06 LAB — T4, FREE: Free T4: 0.8 ng/dL (ref 0.60–1.60)

## 2021-12-06 MED ORDER — SIMVASTATIN 20 MG PO TABS: 20.0000 mg | ORAL_TABLET | Freq: Every day | ORAL | 3 refills | Status: DC

## 2021-12-06 NOTE — Progress Notes (Signed)
Subjective:     Franklin [name] is a 66 y.o. male and is here for a comprehensive physical exam. The patient reports doing well overall.  Patient states he gained 3 pounds over the holidays but plans to get back to his normal weight.  Pt playing pickle ball, walking, and going to the gym regularly.  Taking simvastatin 20 mg daily.  Denies myalgias.  Seen by allergist.  Typically has sinus infection once every few years.  Endorses history of deviated septum/nasal procedure with ENT when living in [place].  Notes increased in allergy/sinus symptoms since moving to [place] 4 years ago.  Using saline nasal rinse daily and Flonase as needed.  Had skin checked with dermatology last year.  Wearing sunscreen when out.  Patient had Shingrix vaccines and influenza vaccine.  Repeat colonoscopy scheduled for 2024 after review of prior colonoscopy notes.  States was advised that the type of polyp removed qualified him for repeat colonoscopy in 7 years.  Social History   Socioeconomic History   Marital status: Married    Spouse name: Not on file   Number of children: Not on file   Years of education: Not on file   Highest education level: Not on file  Occupational History   Not on file  Tobacco Use   Smoking status: Never   Smokeless tobacco: Never  Substance and Sexual Activity   Alcohol use: Yes    Comment: OCCASIONAL   Drug use: Never   Sexual activity: Not on file  Other Topics Concern   Not on file  Social History Narrative   Not on file   Social Determinants of Health   Financial Resource Strain: Not on file  Food Insecurity: Not on file  Transportation Needs: Not on file  Physical Activity: Not on file  Stress: Not on file  Social Connections: Not on file  Intimate Partner Violence: Not on file   Health Maintenance  Topic Date Due   Hepatitis C Screening  Never done   TETANUS/TDAP  Never done   COLONOSCOPY (Pts 45-10yrs Insurance coverage will need to be confirmed)  Never done    Pneumonia Vaccine 2+ Years old (1 - PCV) Never done   COVID-19 Vaccine (4 - Booster for [place] series) 02/16/2021   INFLUENZA VACCINE  Completed   Zoster Vaccines- Shingrix  Completed   HPV VACCINES  Aged Out    The following portions of the patient's history were reviewed and updated as appropriate: allergies, current medications, past family history, past medical history, past social history, past surgical history, and problem list.  Review of Systems Pertinent items noted in HPI and remainder of comprehensive ROS otherwise negative.   Objective:    BP 90/60 (BP Location: Left Arm, Patient Position: Sitting, Cuff Size: Normal)    Pulse (!) 58    Temp 98.2 F (36.8 C) (Oral)    Ht 5\' 6"  (1.676 m)    Wt 159 lb (72.1 kg)    SpO2 99%    BMI 25.66 kg/m  General appearance: alert, cooperative, and no distress Head: Normocephalic, without obvious abnormality, atraumatic Eyes: conjunctivae/corneas clear. PERRL, EOM's intact. Fundi benign. Ears: normal TM's and external ear canals both ears Nose: Nares normal. Septum deviated. mucosa normal. No drainage or sinus tenderness. Throat: lips, mucosa, and tongue normal; teeth and gums normal Neck: no adenopathy, no carotid bruit, no JVD, supple, symmetrical, trachea midline, and thyroid not enlarged, symmetric, no tenderness/mass/nodules Lungs: clear to auscultation bilaterally Heart: regular rate and rhythm, S1,  S2 normal, no murmur, click, rub or gallop Abdomen: soft, non-tender; bowel sounds normal; no masses,  no organomegaly Extremities: extremities normal, atraumatic, no cyanosis or edema Pulses: 2+ and symmetric Skin: Skin color, texture, turgor normal. No rashes or lesions Lymph nodes: Cervical, supraclavicular, and axillary nodes normal. Neurologic: Alert and oriented X 3, normal strength and tone. Normal symmetric reflexes. Normal coordination and gait     Assessment:    Healthy male exam with history of HLD and deviated septum    Plan:    Anticipatory guidance given including wearing seatbelts, smoke detectors in the home, increasing physical activity, increasing p.o. intake of water and vegetables. -Labs -Colonoscopy scheduled for 2024 -Reviewed immunizations.  Discussed pneumonia vaccine.  Patient given information regarding various vaccines.  If interested can schedule for at local pharmacy. -Given handouts -Next CPE in 1 year See After Visit Summary for Counseling Recommendations   Mixed hyperlipidemia -Controlled -Continue lifestyle modifications -Continue Zocor 20 mg daily - Plan: Hemoglobin A1c, Lipid panel, CMP, simvastatin (ZOCOR) 20 MG tablet  Screening for diabetes mellitus - Plan: Hemoglobin A1c  Screening for prostate cancer -PSA 1.600 ng on 10/07/2020 - Plan: PSA  Encounter for hepatitis C screening test for low risk patient - Plan: Hep C Antibody  Deviated septum -Continue saline nasal rinse daily and Flonase as needed -Consider OTC antihistamine for increased allergy/sinus symptoms -If needed will place referral to ENT for increased/worsening symptoms  F/u prn  Grier Mitts, MD

## 2021-12-06 NOTE — Patient Instructions (Addendum)
Consider getting a Prevnar (PCV20) vaccine which is one dose.  This can be done in clinic as a nurse's visit or at your local pharmacy.  Allergy/sinus symptoms consider seeing ENT.  If needed we can place this referral for you.

## 2021-12-07 LAB — HEPATITIS C ANTIBODY
Hepatitis C Ab: NONREACTIVE
SIGNAL TO CUT-OFF: 0.12 (ref <1.00)

## 2021-12-14 DIAGNOSIS — J301 Allergic rhinitis due to pollen: Secondary | ICD-10-CM | POA: Diagnosis not present

## 2021-12-14 DIAGNOSIS — J3081 Allergic rhinitis due to animal (cat) (dog) hair and dander: Secondary | ICD-10-CM | POA: Diagnosis not present

## 2021-12-14 DIAGNOSIS — J3089 Other allergic rhinitis: Secondary | ICD-10-CM | POA: Diagnosis not present

## 2021-12-22 DIAGNOSIS — J3081 Allergic rhinitis due to animal (cat) (dog) hair and dander: Secondary | ICD-10-CM | POA: Diagnosis not present

## 2021-12-22 DIAGNOSIS — J339 Nasal polyp, unspecified: Secondary | ICD-10-CM | POA: Diagnosis not present

## 2021-12-22 DIAGNOSIS — J3089 Other allergic rhinitis: Secondary | ICD-10-CM | POA: Diagnosis not present

## 2021-12-22 DIAGNOSIS — J301 Allergic rhinitis due to pollen: Secondary | ICD-10-CM | POA: Diagnosis not present

## 2021-12-30 DIAGNOSIS — J3081 Allergic rhinitis due to animal (cat) (dog) hair and dander: Secondary | ICD-10-CM | POA: Diagnosis not present

## 2021-12-30 DIAGNOSIS — J3089 Other allergic rhinitis: Secondary | ICD-10-CM | POA: Diagnosis not present

## 2021-12-30 DIAGNOSIS — J301 Allergic rhinitis due to pollen: Secondary | ICD-10-CM | POA: Diagnosis not present

## 2022-01-06 DIAGNOSIS — J329 Chronic sinusitis, unspecified: Secondary | ICD-10-CM | POA: Insufficient documentation

## 2022-01-12 DIAGNOSIS — J3089 Other allergic rhinitis: Secondary | ICD-10-CM | POA: Diagnosis not present

## 2022-01-12 DIAGNOSIS — J3081 Allergic rhinitis due to animal (cat) (dog) hair and dander: Secondary | ICD-10-CM | POA: Diagnosis not present

## 2022-01-12 DIAGNOSIS — J301 Allergic rhinitis due to pollen: Secondary | ICD-10-CM | POA: Diagnosis not present

## 2022-01-26 DIAGNOSIS — J301 Allergic rhinitis due to pollen: Secondary | ICD-10-CM | POA: Diagnosis not present

## 2022-01-26 DIAGNOSIS — J3081 Allergic rhinitis due to animal (cat) (dog) hair and dander: Secondary | ICD-10-CM | POA: Diagnosis not present

## 2022-01-26 DIAGNOSIS — J3089 Other allergic rhinitis: Secondary | ICD-10-CM | POA: Diagnosis not present

## 2022-02-05 DIAGNOSIS — H1033 Unspecified acute conjunctivitis, bilateral: Secondary | ICD-10-CM | POA: Diagnosis not present

## 2022-02-10 DIAGNOSIS — J301 Allergic rhinitis due to pollen: Secondary | ICD-10-CM | POA: Diagnosis not present

## 2022-02-10 DIAGNOSIS — J3089 Other allergic rhinitis: Secondary | ICD-10-CM | POA: Diagnosis not present

## 2022-02-10 DIAGNOSIS — J3081 Allergic rhinitis due to animal (cat) (dog) hair and dander: Secondary | ICD-10-CM | POA: Diagnosis not present

## 2022-02-22 DIAGNOSIS — J3089 Other allergic rhinitis: Secondary | ICD-10-CM | POA: Diagnosis not present

## 2022-02-22 DIAGNOSIS — J3081 Allergic rhinitis due to animal (cat) (dog) hair and dander: Secondary | ICD-10-CM | POA: Diagnosis not present

## 2022-02-22 DIAGNOSIS — J301 Allergic rhinitis due to pollen: Secondary | ICD-10-CM | POA: Diagnosis not present

## 2022-03-07 ENCOUNTER — Ambulatory Visit (INDEPENDENT_AMBULATORY_CARE_PROVIDER_SITE_OTHER): Payer: Medicare HMO | Admitting: Podiatry

## 2022-03-07 ENCOUNTER — Ambulatory Visit (INDEPENDENT_AMBULATORY_CARE_PROVIDER_SITE_OTHER): Payer: Medicare HMO

## 2022-03-07 VITALS — Ht 66.0 in | Wt 159.0 lb

## 2022-03-07 DIAGNOSIS — L600 Ingrowing nail: Secondary | ICD-10-CM | POA: Diagnosis not present

## 2022-03-07 DIAGNOSIS — Z0000 Encounter for general adult medical examination without abnormal findings: Secondary | ICD-10-CM

## 2022-03-07 MED ORDER — NEOMYCIN-POLYMYXIN-HC 1 % OT SOLN: OTIC | 0 refills | Status: DC

## 2022-03-07 NOTE — Patient Instructions (Addendum)
?Mr. Grundman , ?Thank you for taking time to come for your Medicare Wellness Visit. I appreciate your ongoing commitment to your health goals. Please review the following plan we discussed and let me know if I can assist you in the future.  ? ?These are the goals we discussed: ? Goals   ? ?   Increase physical activity (pt-stated)   ?   Continue to go to gym and maintain diet changes. ?  ? ?  ?  ?This is a list of the screening recommended for you and due dates:  ?Health Maintenance  ?Topic Date Due  ? COVID-19 Vaccine (4 - Booster for Pfizer series) 03/23/2022*  ? Pneumonia Vaccine (1 - PCV) 03/08/2023*  ? Colon Cancer Screening  03/08/2023*  ? Tetanus Vaccine  03/08/2023*  ? Flu Shot  06/28/2022  ? Hepatitis C Screening: USPSTF Recommendation to screen - Ages 55-79 yo.  Completed  ? Zoster (Shingles) Vaccine  Completed  ? HPV Vaccine  Aged Out  ?*Topic was postponed. The date shown is not the original due date.  ? ?Advanced directives: Yes Patient will bring copy ? ?Conditions/risks identified: None ? ?Next appointment: Follow up in one year for your annual wellness visit.  ? ?Preventive Care 67 Years and Older, Male ?Preventive care refers to lifestyle choices and visits with your health care provider that can promote health and wellness. ?What does preventive care include? ?A yearly physical exam. This is also called an annual well check. ?Dental exams once or twice a year. ?Routine eye exams. Ask your health care provider how often you should have your eyes checked. ?Personal lifestyle choices, including: ?Daily care of your teeth and gums. ?Regular physical activity. ?Eating a healthy diet. ?Avoiding tobacco and drug use. ?Limiting alcohol use. ?Practicing safe sex. ?Taking low doses of aspirin every day. ?Taking vitamin and mineral supplements as recommended by your health care provider. ?What happens during an annual well check? ?The services and screenings done by your health care provider during your  annual well check will depend on your age, overall health, lifestyle risk factors, and family history of disease. ?Counseling  ?Your health care provider may ask you questions about your: ?Alcohol use. ?Tobacco use. ?Drug use. ?Emotional well-being. ?Home and relationship well-being. ?Sexual activity. ?Eating habits. ?History of falls. ?Memory and ability to understand (cognition). ?Work and work Statistician. ?Screening  ?You may have the following tests or measurements: ?Height, weight, and BMI. ?Blood pressure. ?Lipid and cholesterol levels. These may be checked every 5 years, or more frequently if you are over 93 years old. ?Skin check. ?Lung cancer screening. You may have this screening every year starting at age 33 if you have a 30-pack-year history of smoking and currently smoke or have quit within the past 15 years. ?Fecal occult blood test (FOBT) of the stool. You may have this test every year starting at age 73. ?Flexible sigmoidoscopy or colonoscopy. You may have a sigmoidoscopy every 5 years or a colonoscopy every 10 years starting at age 25. ?Prostate cancer screening. Recommendations will vary depending on your family history and other risks. ?Hepatitis C blood test. ?Hepatitis B blood test. ?Sexually transmitted disease (STD) testing. ?Diabetes screening. This is done by checking your blood sugar (glucose) after you have not eaten for a while (fasting). You may have this done every 1-3 years. ?Abdominal aortic aneurysm (AAA) screening. You may need this if you are a current or former smoker. ?Osteoporosis. You may be screened starting at age 19 if  you are at high risk. ?Talk with your health care provider about your test results, treatment options, and if necessary, the need for more tests. ?Vaccines  ?Your health care provider may recommend certain vaccines, such as: ?Influenza vaccine. This is recommended every year. ?Tetanus, diphtheria, and acellular pertussis (Tdap, Td) vaccine. You may need a Td  booster every 10 years. ?Zoster vaccine. You may need this after age 67. ?Pneumococcal 13-valent conjugate (PCV13) vaccine. One dose is recommended after age 28. ?Pneumococcal polysaccharide (PPSV23) vaccine. One dose is recommended after age 25. ?Talk to your health care provider about which screenings and vaccines you need and how often you need them. ?This information is not intended to replace advice given to you by your health care provider. Make sure you discuss any questions you have with your health care provider. ?Document Released: 12/11/2015 Document Revised: 08/03/2016 Document Reviewed: 09/15/2015 ?Elsevier Interactive Patient Education ? 2017 [place]. ? ?Fall Prevention in the Home ?Falls can cause injuries. They can happen to people of all ages. There are many things you can do to make your home safe and to help prevent falls. ?What can I do on the outside of my home? ?Regularly fix the edges of walkways and driveways and fix any cracks. ?Remove anything that might make you trip as you walk through a door, such as a raised step or threshold. ?Trim any bushes or trees on the path to your home. ?Use bright outdoor lighting. ?Clear any walking paths of anything that might make someone trip, such as rocks or tools. ?Regularly check to see if handrails are loose or broken. Make sure that both sides of any steps have handrails. ?Any raised decks and porches should have guardrails on the edges. ?Have any leaves, snow, or ice cleared regularly. ?Use sand or salt on walking paths during winter. ?Clean up any spills in your garage right away. This includes oil or grease spills. ?What can I do in the bathroom? ?Use night lights. ?Install grab bars by the toilet and in the tub and shower. Do not use towel bars as grab bars. ?Use non-skid mats or decals in the tub or shower. ?If you need to sit down in the shower, use a plastic, non-slip stool. ?Keep the floor dry. Clean up any water that spills on the floor  as soon as it happens. ?Remove soap buildup in the tub or shower regularly. ?Attach bath mats securely with double-sided non-slip rug tape. ?Do not have throw rugs and other things on the floor that can make you trip. ?What can I do in the bedroom? ?Use night lights. ?Make sure that you have a light by your bed that is easy to reach. ?Do not use any sheets or blankets that are too big for your bed. They should not hang down onto the floor. ?Have a firm chair that has side arms. You can use this for support while you get dressed. ?Do not have throw rugs and other things on the floor that can make you trip. ?What can I do in the kitchen? ?Clean up any spills right away. ?Avoid walking on wet floors. ?Keep items that you use a lot in easy-to-reach places. ?If you need to reach something above you, use a strong step stool that has a grab bar. ?Keep electrical cords out of the way. ?Do not use floor polish or wax that makes floors slippery. If you must use wax, use non-skid floor wax. ?Do not have throw rugs and other things on  the floor that can make you trip. ?What can I do with my stairs? ?Do not leave any items on the stairs. ?Make sure that there are handrails on both sides of the stairs and use them. Fix handrails that are broken or loose. Make sure that handrails are as long as the stairways. ?Check any carpeting to make sure that it is firmly attached to the stairs. Fix any carpet that is loose or worn. ?Avoid having throw rugs at the top or bottom of the stairs. If you do have throw rugs, attach them to the floor with carpet tape. ?Make sure that you have a light switch at the top of the stairs and the bottom of the stairs. If you do not have them, ask someone to add them for you. ?What else can I do to help prevent falls? ?Wear shoes that: ?Do not have high heels. ?Have rubber bottoms. ?Are comfortable and fit you well. ?Are closed at the toe. Do not wear sandals. ?If you use a stepladder: ?Make sure that it is  fully opened. Do not climb a closed stepladder. ?Make sure that both sides of the stepladder are locked into place. ?Ask someone to hold it for you, if possible. ?Clearly mark and make sure that you c

## 2022-03-07 NOTE — Progress Notes (Signed)
?  Subjective:  ?Patient ID: Franklin [name], male    DOB: 16-May-1956,  MRN: [id number] ? ?Ingrown nail medial border right hallux ? ?66 y.o. male presents with the above complaint. History confirmed with patient.  Given to the nail about a year ago by dropping something on it, the nail regrew and dug down into the corners been pretty painful.  Thinks may have been infected a few months ago ? ?Objective:  ?Physical Exam: ?warm, good capillary refill, no trophic changes or ulcerative lesions, normal DP and PT pulses, normal sensory exam, and ingrown nail right hallux medial border. ? ?Assessment:  ? ?1. Ingrowing right great toenail   ? ? ? ?Plan:  ?Patient was evaluated and treated and all questions answered. ? ? ? ?Ingrown Nail, right ?-Patient elects to proceed with minor surgery to remove ingrown toenail today. Consent reviewed and signed by patient. ?-Ingrown nail excised. See procedure note. ?-Educated on post-procedure care including soaking. Written instructions provided and reviewed. ?-Patient to follow up in 2 weeks for nail check. ? ?Procedure: Excision of Ingrown Toenail ?Location: Right 1st toe medial nail borders. ?Anesthesia: Lidocaine 1% plain; 1.5 mL and Marcaine 0.5% plain; 1.5 mL, digital block. ?Skin Prep: Betadine. ?Dressing: Silvadene; telfa; dry, sterile, compression dressing. ?Technique: Following skin prep, the toe was exsanguinated and a tourniquet was secured at the base of the toe. The affected nail border was freed, split with a nail splitter, and excised. Chemical matrixectomy was then performed with phenol and irrigated out with alcohol. The tourniquet was then removed and sterile dressing applied. ?Disposition: Patient tolerated procedure well. Patient to return in 2 weeks for follow-up.  ? ? ?Return in about 2 weeks (around 03/21/2022) for nail re-check.  ? ?

## 2022-03-07 NOTE — Progress Notes (Addendum)
? ?Subjective:  ? Franklin [name] is a 66 y.o. male who presents for Medicare Annual/Subsequent preventive examination. ? ?Review of Systems    ?Virtual Visit via Telephone Note ? ?I connected with  [name] on 03/07/22 at  3:00 PM EDT by telephone and verified that I am speaking with the correct person using two identifiers. ? ?Location: ?Patient: Home ?Provider: Office ?Persons participating in the virtual visit: patient/Nurse Health Advisor ?  ?I discussed the limitations, risks, security and privacy concerns of performing an evaluation and management service by telephone and the availability of in person appointments. The patient expressed understanding and agreed to proceed. ? ?Interactive audio and video telecommunications were attempted between this nurse and patient, however failed, due to patient having technical difficulties OR patient did not have access to video capability.  We continued and completed visit with audio only. ? ?Some vital signs may be absent or patient reported.  ? ?Criselda Peaches, LPN  ?Cardiac Risk Factors include: advanced age (>33mn, >>88women) ? ?   ?Objective:  ?  ?Today's Vitals  ? 03/07/22 1456  ?Weight: 159 lb (72.1 kg)  ?Height: '5\' 6"'$  (1.676 m)  ? ?Body mass index is 25.66 kg/m?. ? ? ?  03/07/2022  ?  3:08 PM 11/09/2018  ?  6:36 AM 11/01/2018  ?  9:41 AM 05/24/2018  ?  9:40 AM  ?Advanced Directives  ?Does Patient Have a Medical Advance Directive? Yes Yes Yes Yes  ?Type of AParamedicof [place]akaLiving will HWide RuinsLiving will [place]Living will [place]lenview ManorLiving will  ?Does patient want to make changes to medical advance directive? No - Patient declined No - Patient declined    ?Copy of [place]? No - copy requested No - copy requested  No - copy requested  ? ? ?Current Medications (verified) ?Outpatient Encounter Medications as of 03/07/2022  ?Medication Sig  ?  BABY ASPIRIN PO Take 160 mg by mouth daily.  ? Coenzyme Q10-Fish Oil-Vit E (CO-Q 10 OMEGA-3 FISH OIL PO) Take 2,000 Units by mouth daily.  ? NEOMYCIN-POLYMYXIN-HYDROCORTISONE (CORTISPORIN) 1 % SOLN OTIC solution Apply to nail beds from procedure site twice daily after soaks  ? simvastatin (ZOCOR) 20 MG tablet Take 1 tablet (20 mg total) by mouth daily.  ? Triamcinolone Acetonide (NASACORT ALLERGY 24HR NA) Place into the nose as needed.  ? [DISCONTINUED] oxyCODONE (OXY IR/ROXICODONE) 5 MG immediate release tablet Take 0.5-1 tablets (2.5-5 mg total) by mouth every 6 (six) hours as needed for severe pain.  ? ?No facility-administered encounter medications on file as of 03/07/2022.  ? ? ?Allergies (verified) ?Patient has no known allergies.  ? ?History: ?Past Medical History:  ?Diagnosis Date  ? GERD (gastroesophageal reflux disease)   ? Hyperlipidemia   ? ?Past Surgical History:  ?Procedure Laterality Date  ? APPENDECTOMY    ? CATARACT    ? SINUS POLLYPS    ? TONSILLOCTOMY    ? UMBILICAL HERNIA REPAIR N/A 11/09/2018  ? Procedure: HERNIA REPAIR UMBILICAL WITH POSSIBLE MESH ERAS PATHWAY;  Surgeon: WJudeth Horn MD;  [facility]  Service: General;  Laterality: N/A;  ? ?History reviewed. No pertinent family history. ?Social History  ? ?Socioeconomic History  ? Marital status: Married  ?  Spouse name: Not on file  ? Number of children: Not on file  ? Years of education: Not on file  ? Highest education level: Not on file  ?  Occupational History  ? Not on file  ?Tobacco Use  ? Smoking status: Never  ? Smokeless tobacco: Never  ?Substance and Sexual Activity  ? Alcohol use: Yes  ?  Comment: OCCASIONAL  ? Drug use: Never  ? Sexual activity: Not on file  ?Other Topics Concern  ? Not on file  ?Social History Narrative  ? Not on file  ? ?Social Determinants of Health  ? ?Financial Resource Strain: Low Risk   ? Difficulty of Paying Living Expenses: Not hard at all  ?Food Insecurity: No Food Insecurity  ?  Worried About Charity fundraiser in the Last Year: Never true  ? Ran Out of Food in the Last Year: Never true  ?Transportation Needs: No Transportation Needs  ? Lack of Transportation (Medical): No  ? Lack of Transportation (Non-Medical): No  ?Physical Activity: Sufficiently Active  ? Days of Exercise per Week: 3 days  ? Minutes of Exercise per Session: 110 min  ?Stress: No Stress Concern Present  ? Feeling of Stress : Not at all  ?Social Connections: Socially Integrated  ? Frequency of Communication with Friends and Family: More than three times a week  ? Frequency of Social Gatherings with Friends and Family: More than three times a week  ? Attends Religious Services: More than 4 times per year  ? Active Member of Clubs or Organizations: Yes  ? Attends Archivist Meetings: More than 4 times per year  ? Marital Status: Married  ? ? ? ? ?Clinical Intake: ?How often do you need to have someone help you when you read instructions, pamphlets, or other written materials from your doctor or pharmacy?: 1 - Never ? ?Diabetic?  No ? ?Interpreter Needed?: No ? ?Information entered by :: Rolene Arbour LPN ? ? ?Activities of Daily Living ? ?  03/07/2022  ?  3:06 PM  ?In your present state of health, do you have any difficulty performing the following activities:  ?Hearing? 0  ?Vision? 0  ?Difficulty concentrating or making decisions? 0  ?Walking or climbing stairs? 0  ?Dressing or bathing? 0  ?Doing errands, shopping? 0  ?Preparing Food and eating ? N  ?Using the Toilet? N  ?In the past six months, have you accidently leaked urine? N  ?Do you have problems with loss of bowel control? N  ?Managing your Medications? N  ?Managing your Finances? N  ?Housekeeping or managing your Housekeeping? N  ? ? ?Patient Care Team: ?Billie Ruddy, MD as PCP - General (Family Medicine) ? ?Indicate any recent Medical Services you may have received from other than [facility] providers in the past year (date may be approximate). ? ?    ?Assessment:  ? This is a routine wellness examination for [place]. ? ?Hearing/Vision screen ?Hearing Screening - Comments:: No hearing difficulty ?Vision Screening - Comments:: Wears glasses followed by [facility] ? ?Dietary issues and exercise activities discussed: ?Exercise limited by: None identified ? ? Goals Addressed   ? ?  ?  ?  ?  ?  ? This Visit's Progress  ?   Increase physical activity (pt-stated)     ?   Continue to go to gym and maintain diet changes. ?  ? ?  ? ?Depression Screen ? ?  03/07/2022  ?  3:04 PM 12/06/2021  ?  9:04 AM  ?PHQ 2/9 Scores  ?PHQ - 2 Score 0 0  ?  ?Fall Risk ? ?  03/07/2022  ?  3:06 PM 12/06/2021  ?  9:04 AM  ?Fall Risk   ?Falls in the past year? 0 0  ?Number falls in past yr: 0 0  ?Injury with Fall? 0 0  ?Risk for fall due to : No Fall Risks No Fall Risks  ?Follow up  Falls evaluation completed  ? ? ?FALL RISK PREVENTION PERTAINING TO THE HOME: ? ?Any stairs in or around the home? Yes  ?If so, are there any without handrails? No  ?Home free of loose throw rugs in walkways, pet beds, electrical cords, etc? Yes  ?Adequate lighting in your home to reduce risk of falls? Yes  ? ?ASSISTIVE DEVICES UTILIZED TO PREVENT FALLS: ? ?Life alert? No  ?Use of a cane, walker or w/c? No  ?Grab bars in the bathroom? No  ?Shower chair or bench in shower? Yes  ?Elevated toilet seat or a handicapped toilet? No  ? ?TIMED UP AND GO: ? ?Was the test performed? No . Audio Visit ? ?Cognitive Function: ?  ? ?Immunizations ?Immunization History  ?Administered Date(s) Administered  ? Influenza, High Dose Seasonal PF 01/16/2019, 12/22/2020, 09/13/2021  ? Influenza,inj,Quad PF,6+ Mos 09/23/2017, 08/29/2018, 09/04/2019  ? Influenza-Unspecified 09/11/2020  ? PFIZER(Purple Top)SARS-COV-2 Vaccination 01/04/2020, 01/28/2020, 12/22/2020  ? Zoster Recombinat (Shingrix) 09/18/2018, 11/19/2018  ? ? ?TDAP status: Due, Education has been provided regarding the importance of this vaccine. Advised may receive this vaccine at  local pharmacy or Health Dept. Aware to provide a copy of the vaccination record if obtained from local pharmacy or Health Dept. Verbalized acceptance and understanding. ? ?Flu Vaccine status: Up to date ? ?Pn

## 2022-03-07 NOTE — Patient Instructions (Signed)

## 2022-03-09 DIAGNOSIS — J3089 Other allergic rhinitis: Secondary | ICD-10-CM | POA: Diagnosis not present

## 2022-03-09 DIAGNOSIS — J3081 Allergic rhinitis due to animal (cat) (dog) hair and dander: Secondary | ICD-10-CM | POA: Diagnosis not present

## 2022-03-09 DIAGNOSIS — J301 Allergic rhinitis due to pollen: Secondary | ICD-10-CM | POA: Diagnosis not present

## 2022-03-09 NOTE — Addendum Note (Signed)
Addended by: Criselda Peaches on: 03/09/2022 08:01 AM ? ? Modules accepted: Orders ? ?

## 2022-03-21 DIAGNOSIS — J3081 Allergic rhinitis due to animal (cat) (dog) hair and dander: Secondary | ICD-10-CM | POA: Diagnosis not present

## 2022-03-21 DIAGNOSIS — J3089 Other allergic rhinitis: Secondary | ICD-10-CM | POA: Diagnosis not present

## 2022-03-21 DIAGNOSIS — J301 Allergic rhinitis due to pollen: Secondary | ICD-10-CM | POA: Diagnosis not present

## 2022-04-04 DIAGNOSIS — J301 Allergic rhinitis due to pollen: Secondary | ICD-10-CM | POA: Diagnosis not present

## 2022-04-04 DIAGNOSIS — J3089 Other allergic rhinitis: Secondary | ICD-10-CM | POA: Diagnosis not present

## 2022-04-04 DIAGNOSIS — J3081 Allergic rhinitis due to animal (cat) (dog) hair and dander: Secondary | ICD-10-CM | POA: Diagnosis not present

## 2022-04-18 DIAGNOSIS — J3089 Other allergic rhinitis: Secondary | ICD-10-CM | POA: Diagnosis not present

## 2022-04-18 DIAGNOSIS — J301 Allergic rhinitis due to pollen: Secondary | ICD-10-CM | POA: Diagnosis not present

## 2022-04-18 DIAGNOSIS — J3081 Allergic rhinitis due to animal (cat) (dog) hair and dander: Secondary | ICD-10-CM | POA: Diagnosis not present

## 2022-04-19 ENCOUNTER — Telehealth: Payer: Self-pay

## 2022-04-19 ENCOUNTER — Encounter: Payer: Self-pay | Admitting: Family Medicine

## 2022-04-19 NOTE — Telephone Encounter (Signed)
Abstracted results today.  Recommendation was for colonoscopy in 80yr. Last in 2017.  LVM instructions for pt to call back to see if he wanted referral placed for this yr vs next year (as noted in last OV notes in Jan 2023).

## 2022-04-27 DIAGNOSIS — J3089 Other allergic rhinitis: Secondary | ICD-10-CM | POA: Diagnosis not present

## 2022-04-27 DIAGNOSIS — J3081 Allergic rhinitis due to animal (cat) (dog) hair and dander: Secondary | ICD-10-CM | POA: Diagnosis not present

## 2022-04-27 DIAGNOSIS — J301 Allergic rhinitis due to pollen: Secondary | ICD-10-CM | POA: Diagnosis not present

## 2022-05-04 DIAGNOSIS — J3081 Allergic rhinitis due to animal (cat) (dog) hair and dander: Secondary | ICD-10-CM | POA: Diagnosis not present

## 2022-05-04 DIAGNOSIS — J3089 Other allergic rhinitis: Secondary | ICD-10-CM | POA: Diagnosis not present

## 2022-05-04 DIAGNOSIS — J301 Allergic rhinitis due to pollen: Secondary | ICD-10-CM | POA: Diagnosis not present

## 2022-05-18 DIAGNOSIS — J3081 Allergic rhinitis due to animal (cat) (dog) hair and dander: Secondary | ICD-10-CM | POA: Diagnosis not present

## 2022-05-18 DIAGNOSIS — J3089 Other allergic rhinitis: Secondary | ICD-10-CM | POA: Diagnosis not present

## 2022-05-18 DIAGNOSIS — J301 Allergic rhinitis due to pollen: Secondary | ICD-10-CM | POA: Diagnosis not present

## 2022-06-01 DIAGNOSIS — J3081 Allergic rhinitis due to animal (cat) (dog) hair and dander: Secondary | ICD-10-CM | POA: Diagnosis not present

## 2022-06-01 DIAGNOSIS — J3089 Other allergic rhinitis: Secondary | ICD-10-CM | POA: Diagnosis not present

## 2022-06-01 DIAGNOSIS — J301 Allergic rhinitis due to pollen: Secondary | ICD-10-CM | POA: Diagnosis not present

## 2022-06-15 DIAGNOSIS — J3089 Other allergic rhinitis: Secondary | ICD-10-CM | POA: Diagnosis not present

## 2022-06-15 DIAGNOSIS — J301 Allergic rhinitis due to pollen: Secondary | ICD-10-CM | POA: Diagnosis not present

## 2022-06-15 DIAGNOSIS — J3081 Allergic rhinitis due to animal (cat) (dog) hair and dander: Secondary | ICD-10-CM | POA: Diagnosis not present

## 2022-06-29 DIAGNOSIS — J3089 Other allergic rhinitis: Secondary | ICD-10-CM | POA: Diagnosis not present

## 2022-06-29 DIAGNOSIS — J301 Allergic rhinitis due to pollen: Secondary | ICD-10-CM | POA: Diagnosis not present

## 2022-06-29 DIAGNOSIS — J3081 Allergic rhinitis due to animal (cat) (dog) hair and dander: Secondary | ICD-10-CM | POA: Diagnosis not present

## 2022-07-06 DIAGNOSIS — J3081 Allergic rhinitis due to animal (cat) (dog) hair and dander: Secondary | ICD-10-CM | POA: Diagnosis not present

## 2022-07-06 DIAGNOSIS — J3089 Other allergic rhinitis: Secondary | ICD-10-CM | POA: Diagnosis not present

## 2022-07-06 DIAGNOSIS — J301 Allergic rhinitis due to pollen: Secondary | ICD-10-CM | POA: Diagnosis not present

## 2022-07-13 DIAGNOSIS — J3089 Other allergic rhinitis: Secondary | ICD-10-CM | POA: Diagnosis not present

## 2022-07-13 DIAGNOSIS — J3081 Allergic rhinitis due to animal (cat) (dog) hair and dander: Secondary | ICD-10-CM | POA: Diagnosis not present

## 2022-07-13 DIAGNOSIS — J301 Allergic rhinitis due to pollen: Secondary | ICD-10-CM | POA: Diagnosis not present

## 2022-07-18 DIAGNOSIS — L578 Other skin changes due to chronic exposure to nonionizing radiation: Secondary | ICD-10-CM | POA: Diagnosis not present

## 2022-07-18 DIAGNOSIS — D2271 Melanocytic nevi of right lower limb, including hip: Secondary | ICD-10-CM | POA: Diagnosis not present

## 2022-07-18 DIAGNOSIS — D2262 Melanocytic nevi of left upper limb, including shoulder: Secondary | ICD-10-CM | POA: Diagnosis not present

## 2022-07-18 DIAGNOSIS — D225 Melanocytic nevi of trunk: Secondary | ICD-10-CM | POA: Diagnosis not present

## 2022-07-18 DIAGNOSIS — D2371 Other benign neoplasm of skin of right lower limb, including hip: Secondary | ICD-10-CM | POA: Diagnosis not present

## 2022-07-18 DIAGNOSIS — L814 Other melanin hyperpigmentation: Secondary | ICD-10-CM | POA: Diagnosis not present

## 2022-07-18 DIAGNOSIS — L821 Other seborrheic keratosis: Secondary | ICD-10-CM | POA: Diagnosis not present

## 2022-07-21 DIAGNOSIS — J301 Allergic rhinitis due to pollen: Secondary | ICD-10-CM | POA: Diagnosis not present

## 2022-07-21 DIAGNOSIS — J3089 Other allergic rhinitis: Secondary | ICD-10-CM | POA: Diagnosis not present

## 2022-07-21 DIAGNOSIS — J3081 Allergic rhinitis due to animal (cat) (dog) hair and dander: Secondary | ICD-10-CM | POA: Diagnosis not present

## 2022-07-27 DIAGNOSIS — J3081 Allergic rhinitis due to animal (cat) (dog) hair and dander: Secondary | ICD-10-CM | POA: Diagnosis not present

## 2022-07-27 DIAGNOSIS — J301 Allergic rhinitis due to pollen: Secondary | ICD-10-CM | POA: Diagnosis not present

## 2022-07-27 DIAGNOSIS — J3089 Other allergic rhinitis: Secondary | ICD-10-CM | POA: Diagnosis not present

## 2022-08-02 DIAGNOSIS — J3081 Allergic rhinitis due to animal (cat) (dog) hair and dander: Secondary | ICD-10-CM | POA: Diagnosis not present

## 2022-08-02 DIAGNOSIS — J301 Allergic rhinitis due to pollen: Secondary | ICD-10-CM | POA: Diagnosis not present

## 2022-08-02 DIAGNOSIS — J3089 Other allergic rhinitis: Secondary | ICD-10-CM | POA: Diagnosis not present

## 2022-08-09 DIAGNOSIS — J3089 Other allergic rhinitis: Secondary | ICD-10-CM | POA: Diagnosis not present

## 2022-08-09 DIAGNOSIS — J3081 Allergic rhinitis due to animal (cat) (dog) hair and dander: Secondary | ICD-10-CM | POA: Diagnosis not present

## 2022-08-09 DIAGNOSIS — J301 Allergic rhinitis due to pollen: Secondary | ICD-10-CM | POA: Diagnosis not present

## 2022-08-10 DIAGNOSIS — Z0100 Encounter for examination of eyes and vision without abnormal findings: Secondary | ICD-10-CM | POA: Diagnosis not present

## 2022-08-10 DIAGNOSIS — H5213 Myopia, bilateral: Secondary | ICD-10-CM | POA: Diagnosis not present

## 2022-08-23 DIAGNOSIS — J301 Allergic rhinitis due to pollen: Secondary | ICD-10-CM | POA: Diagnosis not present

## 2022-08-23 DIAGNOSIS — J3081 Allergic rhinitis due to animal (cat) (dog) hair and dander: Secondary | ICD-10-CM | POA: Diagnosis not present

## 2022-08-23 DIAGNOSIS — J3089 Other allergic rhinitis: Secondary | ICD-10-CM | POA: Diagnosis not present

## 2022-09-07 DIAGNOSIS — J301 Allergic rhinitis due to pollen: Secondary | ICD-10-CM | POA: Diagnosis not present

## 2022-09-07 DIAGNOSIS — J3089 Other allergic rhinitis: Secondary | ICD-10-CM | POA: Diagnosis not present

## 2022-09-07 DIAGNOSIS — J3081 Allergic rhinitis due to animal (cat) (dog) hair and dander: Secondary | ICD-10-CM | POA: Diagnosis not present

## 2022-09-08 DIAGNOSIS — L820 Inflamed seborrheic keratosis: Secondary | ICD-10-CM | POA: Diagnosis not present

## 2022-09-16 ENCOUNTER — Telehealth: Payer: Self-pay | Admitting: Family Medicine

## 2022-09-16 NOTE — Telephone Encounter (Signed)
Ok

## 2022-09-16 NOTE — Telephone Encounter (Signed)
Patient is requesting to transfer care from Dr. Volanda Napoleon to Dr. Jerline Pain. States he thinks Volanda Napoleon is great but would like a male doctor since he is getting older. Is this okay?

## 2022-09-20 NOTE — Telephone Encounter (Signed)
LVM to schedule TOC with Jerline Pain

## 2022-09-20 NOTE — Telephone Encounter (Signed)
See PCP note.

## 2022-09-20 NOTE — Telephone Encounter (Signed)
Ok with me 

## 2022-09-21 DIAGNOSIS — J3089 Other allergic rhinitis: Secondary | ICD-10-CM | POA: Diagnosis not present

## 2022-09-21 DIAGNOSIS — J301 Allergic rhinitis due to pollen: Secondary | ICD-10-CM | POA: Diagnosis not present

## 2022-09-21 DIAGNOSIS — J3081 Allergic rhinitis due to animal (cat) (dog) hair and dander: Secondary | ICD-10-CM | POA: Diagnosis not present

## 2022-10-05 ENCOUNTER — Ambulatory Visit (INDEPENDENT_AMBULATORY_CARE_PROVIDER_SITE_OTHER): Payer: Medicare HMO | Admitting: Family Medicine

## 2022-10-05 ENCOUNTER — Encounter: Payer: Self-pay | Admitting: Family Medicine

## 2022-10-05 VITALS — BP 115/73 | HR 71 | Temp 97.5°F | Ht 66.0 in | Wt 155.4 lb

## 2022-10-05 DIAGNOSIS — E785 Hyperlipidemia, unspecified: Secondary | ICD-10-CM | POA: Diagnosis not present

## 2022-10-05 DIAGNOSIS — K219 Gastro-esophageal reflux disease without esophagitis: Secondary | ICD-10-CM

## 2022-10-05 DIAGNOSIS — J301 Allergic rhinitis due to pollen: Secondary | ICD-10-CM | POA: Diagnosis not present

## 2022-10-05 DIAGNOSIS — J3089 Other allergic rhinitis: Secondary | ICD-10-CM | POA: Diagnosis not present

## 2022-10-05 DIAGNOSIS — J302 Other seasonal allergic rhinitis: Secondary | ICD-10-CM | POA: Diagnosis not present

## 2022-10-05 DIAGNOSIS — J3081 Allergic rhinitis due to animal (cat) (dog) hair and dander: Secondary | ICD-10-CM | POA: Diagnosis not present

## 2022-10-05 NOTE — Assessment & Plan Note (Signed)
Follows with allergy.  Is using Nasacort daily.  Gets allergy shots.  Symptoms are overall stable.

## 2022-10-05 NOTE — Progress Notes (Signed)
Franklin [name] is a 66 y.o. male who presents today for an office visit.  He is transferring care to this office.   Assessment/Plan:  Chronic Problems Addressed Today: Dyslipidemia On simvastatin 20 mg daily and tolerating well.  No side effects.  Last lipid panel was at goal.  Does have a strong family history of cardiac illness.  We will continue simvastatin he will come back soon for CPE and we will recheck lipids at that time.  GERD (gastroesophageal reflux disease) Stable.  He tries to avoid specific foods that trigger his symptoms.  Occasionally takes Pepcid or Tums as needed.  Seasonal allergies Follows with allergy.  Is using Nasacort daily.  Gets allergy shots.  Symptoms are overall stable.  We discussed pneumonia vaccine-deferred for today however we discussed when he comes back for CPE.  He will get colonoscopy next year.      Subjective:  HPI:  See A/p for status of chronic conditions.  He has no acute concerns today.  ROS: Per HPI, otherwise a complete review of systems was negative.   PMH:  The following were reviewed and entered/updated in epic: Past Medical History:  Diagnosis Date   GERD (gastroesophageal reflux disease)    Hyperlipidemia    Patient Active Problem List   Diagnosis Date Noted   Chronic pain of both shoulders 05/21/2018   GERD (gastroesophageal reflux disease) 05/21/2018   Seasonal allergies 05/21/2018   Dyslipidemia 05/21/2018   Past Surgical History:  Procedure Laterality Date   APPENDECTOMY     CATARACT     SINUS POLLYPS     TONSILLOCTOMY     UMBILICAL HERNIA REPAIR N/A 11/09/2018   Procedure: HERNIA REPAIR UMBILICAL WITH POSSIBLE MESH ERAS PATHWAY;  Surgeon: Judeth Horn, MD;  Location: [facility];  Service: General;  Laterality: N/A;    Family History  Problem Relation Age of Onset   Heart disease Mother    Heart disease Father    Stroke Brother    Heart disease Brother    Hypertension Brother      Medications- reviewed and updated Current Outpatient Medications  Medication Sig Dispense Refill   BABY ASPIRIN PO Take 160 mg by mouth daily.     Coenzyme Q10-Fish Oil-Vit E (CO-Q 10 OMEGA-3 FISH OIL PO) Take 2,000 Units by mouth daily.     simvastatin (ZOCOR) 20 MG tablet Take 20 mg by mouth daily.     Triamcinolone Acetonide (NASACORT ALLERGY 24HR NA) Place into the nose as needed.     No current facility-administered medications for this visit.    Allergies-reviewed and updated No Known Allergies  Social History   Socioeconomic History   Marital status: Married    Spouse name: Not on file   Number of children: Not on file   Years of education: Not on file   Highest education level: Not on file  Occupational History   Not on file  Tobacco Use   Smoking status: Never   Smokeless tobacco: Never  Substance and Sexual Activity   Alcohol use: Yes    Comment: OCCASIONAL   Drug use: Never   Sexual activity: Yes  Other Topics Concern   Not on file  Social History Narrative   Not on file   Social Determinants of Health   Financial Resource Strain: Low Risk  (03/07/2022)   Overall Financial Resource Strain (CARDIA)    Difficulty of Paying Living Expenses: Not hard at all  Food Insecurity: No Food Insecurity (03/07/2022)  Hunger Vital Sign    Worried About Running Out of Food in the Last Year: Never true    Ran Out of Food in the Last Year: Never true  Transportation Needs: No Transportation Needs (03/07/2022)   PRAPARE - Hydrologist (Medical): No    Lack of Transportation (Non-Medical): No  Physical Activity: Sufficiently Active (03/07/2022)   Exercise Vital Sign    Days of Exercise per Week: 3 days    Minutes of Exercise per Session: 110 min  Stress: No Stress Concern Present (03/07/2022)   [place]    Feeling of Stress : Not at all  Social Connections: [place] (03/07/2022)   Social Connection and Isolation Panel [NHANES]    Frequency of Communication with Friends and Family: More than three times a week    Frequency of Social Gatherings with Friends and Family: More than three times a week    Attends Religious Services: More than 4 times per year    Active Member of [employer] or Organizations: Yes    Attends Music therapist: More than 4 times per year    Marital Status: Married           Objective:  Physical Exam: BP 115/73   Pulse 71   Temp (!) 97.5 F (36.4 C) (Temporal)   Ht '5\' 6"'$  (1.676 m)   Wt 155 lb 6.4 oz (70.5 kg)   SpO2 100%   BMI 25.08 kg/m   Gen: No acute distress, resting comfortably CV: Regular rate and rhythm with no murmurs appreciated Pulm: Normal work of breathing, clear to auscultation bilaterally with no crackles, wheezes, or rhonchi Neuro: Grossly normal, moves all extremities Psych: Normal affect and thought content  Time Spent: 40 minutes of total time was spent on the date of the encounter performing the following actions: chart review prior to seeing the patient including visits with previous PCP, obtaining history, performing a medically necessary exam, counseling on the treatment plan, placing orders, and documenting in our EHR.        Algis Greenhouse. Jerline Pain, MD 10/05/2022 9:56 AM

## 2022-10-05 NOTE — Assessment & Plan Note (Signed)
Stable.  He tries to avoid specific foods that trigger his symptoms.  Occasionally takes Pepcid or Tums as needed.

## 2022-10-05 NOTE — Patient Instructions (Addendum)
It was very nice to see you today!  No changes today.  Keep up the good work!  Please come back soon for your annual physical. Please come back sooner if needed.   Take care, Dr Jerline Pain  PLEASE NOTE:  If you had any lab tests please let us know if you have not heard back within a few days. You may see your results on mychart before we have a chance to review them but we will give you a call once they are reviewed by [place]. If we ordered any referrals today, please let us know if you have not heard from their office within the next week.   Please try these tips to maintain a healthy lifestyle:  Eat at least 3 REAL meals and 1-2 snacks per day.  Aim for no more than 5 hours between eating.  If you eat breakfast, please do so within one hour of getting up.   Each meal should contain half fruits/vegetables, one quarter protein, and one quarter carbs (no bigger than a computer mouse)  Cut down on sweet beverages. This includes juice, soda, and sweet tea.   Drink at least 1 glass of water with each meal and aim for at least 8 glasses per day  Exercise at least 150 minutes every week.

## 2022-10-05 NOTE — Assessment & Plan Note (Signed)
On simvastatin 20 mg daily and tolerating well.  No side effects.  Last lipid panel was at goal.  Does have a strong family history of cardiac illness.  We will continue simvastatin he will come back soon for CPE and we will recheck lipids at that time.

## 2022-10-19 DIAGNOSIS — J3089 Other allergic rhinitis: Secondary | ICD-10-CM | POA: Diagnosis not present

## 2022-10-19 DIAGNOSIS — J3081 Allergic rhinitis due to animal (cat) (dog) hair and dander: Secondary | ICD-10-CM | POA: Diagnosis not present

## 2022-10-19 DIAGNOSIS — J301 Allergic rhinitis due to pollen: Secondary | ICD-10-CM | POA: Diagnosis not present

## 2022-11-02 DIAGNOSIS — J3081 Allergic rhinitis due to animal (cat) (dog) hair and dander: Secondary | ICD-10-CM | POA: Diagnosis not present

## 2022-11-02 DIAGNOSIS — J301 Allergic rhinitis due to pollen: Secondary | ICD-10-CM | POA: Diagnosis not present

## 2022-11-02 DIAGNOSIS — J3089 Other allergic rhinitis: Secondary | ICD-10-CM | POA: Diagnosis not present

## 2022-11-16 DIAGNOSIS — J3089 Other allergic rhinitis: Secondary | ICD-10-CM | POA: Diagnosis not present

## 2022-11-16 DIAGNOSIS — J3081 Allergic rhinitis due to animal (cat) (dog) hair and dander: Secondary | ICD-10-CM | POA: Diagnosis not present

## 2022-11-16 DIAGNOSIS — J301 Allergic rhinitis due to pollen: Secondary | ICD-10-CM | POA: Diagnosis not present

## 2022-11-25 DIAGNOSIS — J3089 Other allergic rhinitis: Secondary | ICD-10-CM | POA: Diagnosis not present

## 2022-11-25 DIAGNOSIS — J301 Allergic rhinitis due to pollen: Secondary | ICD-10-CM | POA: Diagnosis not present

## 2022-11-25 DIAGNOSIS — J3081 Allergic rhinitis due to animal (cat) (dog) hair and dander: Secondary | ICD-10-CM | POA: Diagnosis not present

## 2022-11-30 DIAGNOSIS — J301 Allergic rhinitis due to pollen: Secondary | ICD-10-CM | POA: Diagnosis not present

## 2022-11-30 DIAGNOSIS — J3089 Other allergic rhinitis: Secondary | ICD-10-CM | POA: Diagnosis not present

## 2022-11-30 DIAGNOSIS — J3081 Allergic rhinitis due to animal (cat) (dog) hair and dander: Secondary | ICD-10-CM | POA: Diagnosis not present

## 2022-12-14 ENCOUNTER — Ambulatory Visit (INDEPENDENT_AMBULATORY_CARE_PROVIDER_SITE_OTHER): Payer: Medicare HMO | Admitting: Family Medicine

## 2022-12-14 ENCOUNTER — Encounter: Payer: Self-pay | Admitting: Family Medicine

## 2022-12-14 VITALS — BP 118/64 | HR 50 | Temp 97.7°F | Ht 66.0 in | Wt 157.2 lb

## 2022-12-14 DIAGNOSIS — J302 Other seasonal allergic rhinitis: Secondary | ICD-10-CM | POA: Diagnosis not present

## 2022-12-14 DIAGNOSIS — R739 Hyperglycemia, unspecified: Secondary | ICD-10-CM | POA: Diagnosis not present

## 2022-12-14 DIAGNOSIS — E785 Hyperlipidemia, unspecified: Secondary | ICD-10-CM | POA: Diagnosis not present

## 2022-12-14 DIAGNOSIS — J3081 Allergic rhinitis due to animal (cat) (dog) hair and dander: Secondary | ICD-10-CM | POA: Diagnosis not present

## 2022-12-14 DIAGNOSIS — K4090 Unilateral inguinal hernia, without obstruction or gangrene, not specified as recurrent: Secondary | ICD-10-CM | POA: Diagnosis not present

## 2022-12-14 DIAGNOSIS — K219 Gastro-esophageal reflux disease without esophagitis: Secondary | ICD-10-CM | POA: Diagnosis not present

## 2022-12-14 DIAGNOSIS — Z125 Encounter for screening for malignant neoplasm of prostate: Secondary | ICD-10-CM

## 2022-12-14 DIAGNOSIS — J301 Allergic rhinitis due to pollen: Secondary | ICD-10-CM | POA: Diagnosis not present

## 2022-12-14 DIAGNOSIS — J3089 Other allergic rhinitis: Secondary | ICD-10-CM | POA: Diagnosis not present

## 2022-12-14 DIAGNOSIS — Z0001 Encounter for general adult medical examination with abnormal findings: Secondary | ICD-10-CM | POA: Diagnosis not present

## 2022-12-14 LAB — LIPID PANEL
Cholesterol: 144 mg/dL (ref 0–200)
HDL: 51.5 mg/dL (ref >39.00)
LDL Cholesterol: 65 mg/dL (ref 0–99)
NonHDL: 92.58
Total CHOL/HDL Ratio: 3
Triglycerides: 138 mg/dL (ref 0.0–149.0)
VLDL: 27.6 mg/dL (ref 0.0–40.0)

## 2022-12-14 LAB — CBC
HCT: 43.7 % (ref 39.0–52.0)
Hemoglobin: 15.1 g/dL (ref 13.0–17.0)
MCHC: 34.6 g/dL (ref 30.0–36.0)
MCV: 95.9 fl (ref 78.0–100.0)
Platelets: 201 10*3/uL (ref 150.0–400.0)
RBC: 4.55 Mil/uL (ref 4.22–5.81)
RDW: 12.7 % (ref 11.5–15.5)
WBC: 6.1 10*3/uL (ref 4.0–10.5)

## 2022-12-14 LAB — COMPREHENSIVE METABOLIC PANEL
ALT: 27 U/L (ref 0–53)
AST: 32 U/L (ref 0–37)
Albumin: 4.9 g/dL (ref 3.5–5.2)
Alkaline Phosphatase: 63 U/L (ref 39–117)
BUN: 14 mg/dL (ref 6–23)
CO2: 30 mEq/L (ref 19–32)
Calcium: 9.7 mg/dL (ref 8.4–10.5)
Chloride: 104 mEq/L (ref 96–112)
Creatinine, Ser: 0.83 mg/dL (ref 0.40–1.50)
GFR: 90.83 mL/min (ref >60.00)
Glucose, Bld: 111 mg/dL — ABNORMAL HIGH (ref 70–99)
Potassium: 5.1 mEq/L (ref 3.5–5.1)
Sodium: 142 mEq/L (ref 135–145)
Total Bilirubin: 0.9 mg/dL (ref 0.2–1.2)
Total Protein: 7.5 g/dL (ref 6.0–8.3)

## 2022-12-14 LAB — PSA: PSA: 1.88 ng/mL (ref 0.10–4.00)

## 2022-12-14 LAB — HEMOGLOBIN A1C: Hgb A1c MFr Bld: 5.9 % (ref 4.6–6.5)

## 2022-12-14 LAB — TSH: TSH: 2.24 u[IU]/mL (ref 0.35–5.50)

## 2022-12-14 MED ORDER — SIMVASTATIN 20 MG PO TABS: 20.0000 mg | ORAL_TABLET | Freq: Every day | ORAL | 3 refills | Status: DC

## 2022-12-14 NOTE — Assessment & Plan Note (Signed)
Doing well on simvastatin 20 mg daily.  We will check labs today.  We did discuss switching to high intensity statin however deferred for now.  We discussed cardiac CT scan however deferred for today as well.  Discussed lifestyle modifications.

## 2022-12-14 NOTE — Patient Instructions (Signed)
It was very nice to see you today!  We will check blood work today.  I will refill your cholesterol medication.  You do have a small hernia.  Please let us know if you like to see a surgeon for this.  I will see back in year for your next physical.  Please come back to see [place] sooner if needed.  Take care, Dr Jerline Pain  PLEASE NOTE:  If you had any lab tests, please let us know if you have not heard back within a few days. You may see your results on mychart before we have a chance to review them but we will give you a call once they are reviewed by [place].   If we ordered any referrals today, please let us know if you have not heard from their office within the next week.   If you had any urgent prescriptions sent in today, please check with the pharmacy within an hour of our visit to make sure the prescription was transmitted appropriately.   Please try these tips to maintain a healthy lifestyle:  Eat at least 3 REAL meals and 1-2 snacks per day.  Aim for no more than 5 hours between eating.  If you eat breakfast, please do so within one hour of getting up.   Each meal should contain half fruits/vegetables, one quarter protein, and one quarter carbs (no bigger than a computer mouse)  Cut down on sweet beverages. This includes juice, soda, and sweet tea.   Drink at least 1 glass of water with each meal and aim for at least 8 glasses per day  Exercise at least 150 minutes every week.    Preventive Care 48 Years and Older, Male Preventive care refers to lifestyle choices and visits with your health care provider that can promote health and wellness. Preventive care visits are also called wellness exams. What can I expect for my preventive care visit? Counseling During your preventive care visit, your health care provider may ask about your: Medical history, including: Past medical problems. Family medical history. History of falls. Current health, including: Emotional  well-being. Home life and relationship well-being. Sexual activity. Memory and ability to understand (cognition). Lifestyle, including: Alcohol, nicotine or tobacco, and drug use. Access to firearms. Diet, exercise, and sleep habits. Work and work Statistician. Sunscreen use. Safety issues such as seatbelt and bike helmet use. Physical exam Your health care provider will check your: Height and weight. These may be used to calculate your BMI (body mass index). BMI is a measurement that tells if you are at a healthy weight. Waist circumference. This measures the distance around your waistline. This measurement also tells if you are at a healthy weight and may help predict your risk of certain diseases, such as type 2 diabetes and high blood pressure. Heart rate and blood pressure. Body temperature. Skin for abnormal spots. What immunizations do I need?  Vaccines are usually given at various ages, according to a schedule. Your health care provider will recommend vaccines for you based on your age, medical history, and lifestyle or other factors, such as travel or where you work. What tests do I need? Screening Your health care provider may recommend screening tests for certain conditions. This may include: Lipid and cholesterol levels. Diabetes screening. This is done by checking your blood sugar (glucose) after you have not eaten for a while (fasting). Hepatitis C test. Hepatitis B test. HIV (human immunodeficiency virus) test. STI (sexually transmitted infection) testing, if you are  at risk. Lung cancer screening. Colorectal cancer screening. Prostate cancer screening. Abdominal aortic aneurysm (AAA) screening. You may need this if you are a current or former smoker. Talk with your health care provider about your test results, treatment options, and if necessary, the need for more tests. Follow these instructions at home: Eating and drinking  Eat a diet that includes fresh fruits  and vegetables, whole grains, lean protein, and low-fat dairy products. Limit your intake of foods with high amounts of sugar, saturated fats, and salt. Take vitamin and mineral supplements as recommended by your health care provider. Do not drink alcohol if your health care provider tells you not to drink. If you drink alcohol: Limit how much you have to 0-2 drinks a day. Know how much alcohol is in your drink. In the [place] one drink equals one 12 oz bottle of beer (355 mL), one 5 oz glass of wine (148 mL), or one 1 oz glass of hard liquor (44 mL). Lifestyle Brush your teeth every morning and night with fluoride toothpaste. Floss one time each day. Exercise for at least 30 minutes 5 or more days each week. Do not use any products that contain nicotine or tobacco. These products include cigarettes, chewing tobacco, and vaping devices, such as e-cigarettes. If you need help quitting, ask your health care provider. Do not use drugs. If you are sexually active, practice safe sex. Use a condom or other form of protection to prevent STIs. Take aspirin only as told by your health care provider. Make sure that you understand how much to take and what form to take. Work with your health care provider to find out whether it is safe and beneficial for you to take aspirin daily. Ask your health care provider if you need to take a cholesterol-lowering medicine (statin). Find healthy ways to manage stress, such as: Meditation, yoga, or listening to music. Journaling. Talking to a trusted person. Spending time with friends and family. Safety Always wear your seat belt while driving or riding in a vehicle. Do not drive: If you have been drinking alcohol. Do not ride with someone who has been drinking. When you are tired or distracted. While texting. If you have been using any mind-altering substances or drugs. Wear a helmet and other protective equipment during sports activities. If you have firearms in  your house, make sure you follow all gun safety procedures. Minimize exposure to UV radiation to reduce your risk of skin cancer. What's next? Visit your health care provider once a year for an annual wellness visit. Ask your health care provider how often you should have your eyes and teeth checked. Stay up to date on all vaccines. This information is not intended to replace advice given to you by your health care provider. Make sure you discuss any questions you have with your health care provider. Document Revised: 05/12/2021 Document Reviewed: 05/12/2021 Elsevier Patient Education  [place].

## 2022-12-14 NOTE — Assessment & Plan Note (Signed)
Follows with allergist.  Using Nasacort daily.  Getting allergy shots and doing well.  Symptoms are stable.

## 2022-12-14 NOTE — Assessment & Plan Note (Signed)
No red flags.  No signs of incarceration.  We discussed referral to surgery however he deferred.  We discussed reasons to return to care.  He will let me know if symptoms worsen.

## 2022-12-14 NOTE — Progress Notes (Signed)
Chief Complaint:  Franklin [name] is a 67 y.o. male who presents today for his annual comprehensive physical exam.    Assessment/Plan:  Chronic Problems Addressed Today: Dyslipidemia Doing well on simvastatin 20 mg daily.  We will check labs today.  We did discuss switching to high intensity statin however deferred for now.  We discussed cardiac CT scan however deferred for today as well.  Discussed lifestyle modifications.  Seasonal allergies Follows with allergist.  Using Nasacort daily.  Getting allergy shots and doing well.  Symptoms are stable.  Inguinal hernia No red flags.  No signs of incarceration.  We discussed referral to surgery however he deferred.  We discussed reasons to return to care.  He will let me know if symptoms worsen.  Preventative Healthcare: Check labs.  Needs Tdap and pneumonia vaccine.  Deferred pneumonia vaccine for today but may get next year.  He is considering getting Tdap at the pharmacy.  Up-to-date on shingles vaccine and flu vaccine.  Getting colonoscopy later this year.  Patient Counseling(The following topics were reviewed and/or handout was given):  -Nutrition: Stressed importance of moderation in sodium/caffeine intake, saturated fat and cholesterol, caloric balance, sufficient intake of fresh fruits, vegetables, and fiber.  -Stressed the importance of regular exercise.   -Substance Abuse: Discussed cessation/primary prevention of tobacco, alcohol, or other drug use; driving or other dangerous activities under the influence; availability of treatment for abuse.   -Injury prevention: Discussed safety belts, safety helmets, smoke detector, smoking near bedding or upholstery.   -Sexuality: Discussed sexually transmitted diseases, partner selection, use of condoms, avoidance of unintended pregnancy and contraceptive alternatives.   -Dental health: Discussed importance of regular tooth brushing, flossing, and dental visits.  -Health maintenance and  immunizations reviewed. Please refer to Health maintenance section.  Return to care in 1 year for next preventative visit.     Subjective:  HPI:  He has no acute complaints today. See A/p for status of chronic conditions.   Gets occasional pain in the left groin.  This is been going on for quite a while.  Worse with certain activities such as lifting his grandchildren.  Lifestyle Diet: Balanced. Plenty of fruits and vegetables.  Exercise: Plays golf and pickleball. Works out at the fitness center.      12/14/2022    8:08 AM  Depression screen PHQ 2/9  Decreased Interest 0  Down, Depressed, Hopeless 0  PHQ - 2 Score 0    Health Maintenance Due  Topic Date Due   DTaP/Tdap/Td (1 - Tdap) Never done     ROS: Per HPI, otherwise a complete review of systems was negative.   PMH:  The following were reviewed and entered/updated in epic: Past Medical History:  Diagnosis Date   GERD (gastroesophageal reflux disease)    Hyperlipidemia    Patient Active Problem List   Diagnosis Date Noted   Inguinal hernia 12/14/2022   Chronic pain of both shoulders 05/21/2018   GERD (gastroesophageal reflux disease) 05/21/2018   Seasonal allergies 05/21/2018   Dyslipidemia 05/21/2018   Past Surgical History:  Procedure Laterality Date   APPENDECTOMY     CATARACT     SINUS POLLYPS     TONSILLOCTOMY     UMBILICAL HERNIA REPAIR N/A 11/09/2018   Procedure: HERNIA REPAIR UMBILICAL WITH POSSIBLE MESH ERAS PATHWAY;  Surgeon: Judeth Horn, MD;  Location: [facility];  Service: General;  Laterality: N/A;    Family History  Problem Relation Age of Onset   Heart disease  Mother    Heart disease Father    Stroke Brother    Heart disease Brother    Hypertension Brother     Medications- reviewed and updated Current Outpatient Medications  Medication Sig Dispense Refill   BABY ASPIRIN PO Take 160 mg by mouth daily.     Coenzyme Q10-Fish Oil-Vit E (CO-Q 10 OMEGA-3 FISH OIL PO)  Take 2,000 Units by mouth daily.     EPINEPHrine 0.3 mg/0.3 mL IJ SOAJ injection as directed Injection prn for 30 days As needed     Triamcinolone Acetonide (NASACORT ALLERGY 24HR NA) Place into the nose as needed.     simvastatin (ZOCOR) 20 MG tablet Take 1 tablet (20 mg total) by mouth daily. 90 tablet 3   No current facility-administered medications for this visit.    Allergies-reviewed and updated No Known Allergies  Social History   Socioeconomic History   Marital status: Married    Spouse name: Not on file   Number of children: Not on file   Years of education: Not on file   Highest education level: Not on file  Occupational History   Not on file  Tobacco Use   Smoking status: Never   Smokeless tobacco: Never  Substance and Sexual Activity   Alcohol use: Yes    Comment: OCCASIONAL   Drug use: Never   Sexual activity: Yes  Other Topics Concern   Not on file  Social History Narrative   Not on file   Social Determinants of Health   Financial Resource Strain: Low Risk  (03/07/2022)   Overall Financial Resource Strain (CARDIA)    Difficulty of Paying Living Expenses: Not hard at all  Food Insecurity: No Food Insecurity (03/07/2022)   Hunger Vital Sign    Worried About Running Out of Food in the Last Year: Never true    [place] in the Last Year: Never true  Transportation Needs: No Transportation Needs (03/07/2022)   PRAPARE - Hydrologist (Medical): No    Lack of Transportation (Non-Medical): No  Physical Activity: Sufficiently Active (03/07/2022)   Exercise Vital Sign    Days of Exercise per Week: 3 days    Minutes of Exercise per Session: 110 min  Stress: No Stress Concern Present (03/07/2022)   [place]    Feeling of Stress : Not at all  Social Connections: [place] (03/07/2022)   Social Connection and Isolation Panel [NHANES]    Frequency of  Communication with Friends and Family: More than three times a week    Frequency of Social Gatherings with Friends and Family: More than three times a week    Attends Religious Services: More than 4 times per year    Active Member of [employer] or Organizations: Yes    Attends Music therapist: More than 4 times per year    Marital Status: Married        Objective:  Physical Exam: BP 118/64   Pulse (!) 50   Temp 97.7 F (36.5 C) (Temporal)   Ht '5\' 6"'$  (1.676 m)   Wt 157 lb 3.2 oz (71.3 kg)   SpO2 99%   BMI 25.37 kg/m   Body mass index is 25.37 kg/m. Wt Readings from Last 3 Encounters:  12/14/22 157 lb 3.2 oz (71.3 kg)  10/05/22 155 lb 6.4 oz (70.5 kg)  03/07/22 159 lb (72.1 kg)   Gen: NAD, resting comfortably HEENT: TMs  normal bilaterally. OP clear. No thyromegaly noted.  CV: RRR with no murmurs appreciated Pulm: NWOB, CTAB with no crackles, wheezes, or rhonchi GI: Normal bowel sounds present. Soft, Nontender, Nondistended. GU: Normal male genetalia. Prostate normal in size without masses.  Palpable mass noted in the left inguinal area with Valsalva.  No appreciated testicular masses or lumps. MSK: no edema, cyanosis, or clubbing noted Skin: warm, dry Neuro: CN2-12 grossly intact. Strength 5/5 in upper and lower extremities. Reflexes symmetric and intact bilaterally.  Psych: Normal affect and thought content     Christerpher Clos M. Jerline Pain, MD 12/14/2022 8:52 AM

## 2022-12-16 NOTE — Progress Notes (Signed)
Please inform patient of the following:  Blood sugars borderline but stable compared to last year.  Everything else is within range.  Do not need to make any treatment plan changes at this time.  It is okay for him to continue with the simvastatin 20 mg daily.  We can recheck everything in a year.

## 2022-12-21 DIAGNOSIS — J3081 Allergic rhinitis due to animal (cat) (dog) hair and dander: Secondary | ICD-10-CM | POA: Diagnosis not present

## 2022-12-21 DIAGNOSIS — J339 Nasal polyp, unspecified: Secondary | ICD-10-CM | POA: Diagnosis not present

## 2022-12-21 DIAGNOSIS — J301 Allergic rhinitis due to pollen: Secondary | ICD-10-CM | POA: Diagnosis not present

## 2022-12-21 DIAGNOSIS — J3089 Other allergic rhinitis: Secondary | ICD-10-CM | POA: Diagnosis not present

## 2023-01-11 DIAGNOSIS — J301 Allergic rhinitis due to pollen: Secondary | ICD-10-CM | POA: Diagnosis not present

## 2023-01-11 DIAGNOSIS — J3089 Other allergic rhinitis: Secondary | ICD-10-CM | POA: Diagnosis not present

## 2023-01-11 DIAGNOSIS — J3081 Allergic rhinitis due to animal (cat) (dog) hair and dander: Secondary | ICD-10-CM | POA: Diagnosis not present

## 2023-02-07 DIAGNOSIS — J3081 Allergic rhinitis due to animal (cat) (dog) hair and dander: Secondary | ICD-10-CM | POA: Diagnosis not present

## 2023-02-07 DIAGNOSIS — J301 Allergic rhinitis due to pollen: Secondary | ICD-10-CM | POA: Diagnosis not present

## 2023-02-07 DIAGNOSIS — J3089 Other allergic rhinitis: Secondary | ICD-10-CM | POA: Diagnosis not present

## 2023-02-14 ENCOUNTER — Encounter: Payer: Self-pay | Admitting: Gastroenterology

## 2023-02-28 DIAGNOSIS — J301 Allergic rhinitis due to pollen: Secondary | ICD-10-CM | POA: Diagnosis not present

## 2023-02-28 DIAGNOSIS — J3081 Allergic rhinitis due to animal (cat) (dog) hair and dander: Secondary | ICD-10-CM | POA: Diagnosis not present

## 2023-02-28 DIAGNOSIS — J3089 Other allergic rhinitis: Secondary | ICD-10-CM | POA: Diagnosis not present

## 2023-03-06 ENCOUNTER — Telehealth: Payer: Self-pay | Admitting: Family Medicine

## 2023-03-06 NOTE — Telephone Encounter (Signed)
Copied from CRM [id number]. Topic: Medicare AWV >> Mar 06, 2023 10:57 AM Gwenith Spitz wrote: Reason for CRM: Called patient to schedule Medicare Annual Wellness Visit (AWV). Left message for patient to call back and schedule Medicare Annual Wellness Visit (AWV).  Last date of AWV: 03/07/2022  Please schedule an appointment at any time with Inetta Fermo, [facility]. Please schedule AWVS with Inetta Fermo, [facility].  If any questions, please contact me at [phone].  Thank you ,  Gabriel Cirri [facility] AWV TEAM Direct Dial [phone]

## 2023-03-15 ENCOUNTER — Telehealth: Payer: Self-pay | Admitting: Family Medicine

## 2023-03-15 NOTE — Telephone Encounter (Signed)
Copied from CRM [id number]. Topic: Medicare AWV >> Mar 15, 2023 11:57 AM Gwenith Spitz wrote: Reason for CRM: Called patient to schedule Medicare Annual Wellness Visit (AWV). Left message for patient to call back and schedule Medicare Annual Wellness Visit (AWV).  Last date of AWV: n/a  Please schedule an appointment at any time with Inetta Fermo, [facility]. Please schedule AWVS with Inetta Fermo, [facility].  If any questions, please contact me at [phone].  Thank you ,  Gabriel Cirri [facility] AWV TEAM Direct Dial [phone]

## 2023-03-23 ENCOUNTER — Telehealth: Payer: Self-pay | Admitting: Family Medicine

## 2023-03-23 NOTE — Telephone Encounter (Signed)
Contacted Conservation officer, nature to schedule their annual wellness visit. Patient declined to schedule AWV at this time.  Patient declined, will call us if/when he is ready to schedule.  Gabriel Cirri [facility] AWV TEAM Direct Dial [phone]

## 2023-03-27 DIAGNOSIS — J301 Allergic rhinitis due to pollen: Secondary | ICD-10-CM | POA: Diagnosis not present

## 2023-03-27 DIAGNOSIS — J3089 Other allergic rhinitis: Secondary | ICD-10-CM | POA: Diagnosis not present

## 2023-03-27 DIAGNOSIS — J3081 Allergic rhinitis due to animal (cat) (dog) hair and dander: Secondary | ICD-10-CM | POA: Diagnosis not present

## 2023-04-17 DIAGNOSIS — J3081 Allergic rhinitis due to animal (cat) (dog) hair and dander: Secondary | ICD-10-CM | POA: Diagnosis not present

## 2023-04-17 DIAGNOSIS — J3089 Other allergic rhinitis: Secondary | ICD-10-CM | POA: Diagnosis not present

## 2023-04-17 DIAGNOSIS — J019 Acute sinusitis, unspecified: Secondary | ICD-10-CM | POA: Diagnosis not present

## 2023-04-17 DIAGNOSIS — J301 Allergic rhinitis due to pollen: Secondary | ICD-10-CM | POA: Diagnosis not present

## 2023-04-25 ENCOUNTER — Encounter: Payer: Self-pay | Admitting: Gastroenterology

## 2023-04-25 ENCOUNTER — Ambulatory Visit (AMBULATORY_SURGERY_CENTER): Payer: Medicare HMO

## 2023-04-25 VITALS — Ht 66.0 in | Wt 155.0 lb

## 2023-04-25 DIAGNOSIS — Z86010 Personal history of colonic polyps: Secondary | ICD-10-CM

## 2023-04-25 MED ORDER — NA SULFATE-K SULFATE-MG SULF 17.5-3.13-1.6 GM/177ML PO SOLN: 1.0000 | Freq: Once | ORAL | 0 refills | Status: AC

## 2023-04-25 NOTE — Progress Notes (Signed)
No egg or soy allergy known to patient  No issues known to pt with past sedation with any surgeries or procedures Patient denies ever being told they had issues or difficulty with intubation  No FH of Malignant Hyperthermia Pt is not on diet pills Pt is not on  home 02  Pt is not on blood thinners  Pt denies issues with constipation  No A fib or A flutter Have any cardiac testing pending--no  Pt is ambulatory   Patient's chart reviewed by Cathlyn Parsons CNRA prior to previsit and patient appropriate for the [facility].  Previsit completed and red dot placed by patient's name on their procedure day (on provider's schedule).     PV completed. Prep instructions reviewed and sent to patient via mychart and to address on file. Good rx coupon for [facility] provided.  Pt instructed to use Singlecare.com or GoodRx for a price reduction on prep

## 2023-05-07 ENCOUNTER — Encounter: Payer: Self-pay | Admitting: Certified Registered Nurse Anesthetist

## 2023-05-10 ENCOUNTER — Encounter: Payer: Self-pay | Admitting: Gastroenterology

## 2023-05-10 ENCOUNTER — Ambulatory Visit (AMBULATORY_SURGERY_CENTER): Payer: Medicare HMO | Admitting: Gastroenterology

## 2023-05-10 VITALS — BP 100/65 | HR 47 | Temp 98.4°F | Resp 14 | Ht 66.0 in | Wt 152.2 lb

## 2023-05-10 DIAGNOSIS — Z09 Encounter for follow-up examination after completed treatment for conditions other than malignant neoplasm: Secondary | ICD-10-CM | POA: Diagnosis not present

## 2023-05-10 DIAGNOSIS — D124 Benign neoplasm of descending colon: Secondary | ICD-10-CM | POA: Diagnosis not present

## 2023-05-10 DIAGNOSIS — D120 Benign neoplasm of cecum: Secondary | ICD-10-CM

## 2023-05-10 DIAGNOSIS — Z86010 Personal history of colonic polyps: Secondary | ICD-10-CM

## 2023-05-10 DIAGNOSIS — K635 Polyp of colon: Secondary | ICD-10-CM | POA: Diagnosis not present

## 2023-05-10 DIAGNOSIS — D123 Benign neoplasm of transverse colon: Secondary | ICD-10-CM

## 2023-05-10 DIAGNOSIS — D122 Benign neoplasm of ascending colon: Secondary | ICD-10-CM | POA: Diagnosis not present

## 2023-05-10 MED ORDER — SODIUM CHLORIDE 0.9 % IV SOLN
500.0000 mL | Freq: Once | INTRAVENOUS | Status: DC
Start: 2023-05-10 — End: 2023-05-10

## 2023-05-10 NOTE — Progress Notes (Signed)
Called to room to assist during endoscopic procedure.  Patient ID and intended procedure confirmed with present staff. Received instructions for my participation in the procedure from the performing physician.  

## 2023-05-10 NOTE — Progress Notes (Signed)
Pt's states no medical or surgical changes since previsit or office visit. 

## 2023-05-10 NOTE — Progress Notes (Signed)
Report given to PACU, vss 

## 2023-05-10 NOTE — Progress Notes (Signed)
[facility] History and Physical   Primary Care Physician:  Ardith Dark, MD   Reason for Procedure:   History of colon polyps  Plan:    colonoscopy     HPI: Franklin [name] is a 67 y.o. male  here for colonoscopy surveillance - history of adenoma removed 04/2016.  Marland Kitchen Patient denies any bowel symptoms at this time. No family history of colon cancer known. Otherwise feels well without any cardiopulmonary symptoms.   I have discussed risks / benefits of anesthesia and endoscopic procedure with [name] and they wish to proceed with the exams as outlined today.    Past Medical History:  Diagnosis Date   GERD (gastroesophageal reflux disease)    Hyperlipidemia     Past Surgical History:  Procedure Laterality Date   APPENDECTOMY     CATARACT     SINUS POLLYPS     TONSILLOCTOMY     UMBILICAL HERNIA REPAIR N/A 11/09/2018   Procedure: HERNIA REPAIR UMBILICAL WITH POSSIBLE MESH ERAS PATHWAY;  Surgeon: Jimmye Norman, MD;  Location: [facility];  Service: General;  Laterality: N/A;    Prior to Admission medications   Medication Sig Start Date End Date Taking? Authorizing Provider  BABY ASPIRIN PO Take 160 mg by mouth daily.   Yes [provider]  Coenzyme Q10-Fish Oil-Vit E (CO-Q 10 OMEGA-3 FISH OIL PO) Take 2,000 Units by mouth daily.   Yes [provider]  simvastatin (ZOCOR) 20 MG tablet Take 1 tablet (20 mg total) by mouth daily. 12/14/22  Yes Ardith Dark, MD  EPINEPHrine 0.3 mg/0.3 mL IJ SOAJ injection as directed Injection prn for 30 days As needed Patient not taking: Reported on 05/10/2023 06/11/21   [provider]  Triamcinolone Acetonide (NASACORT ALLERGY 24HR NA) Place into the nose as needed. Patient not taking: Reported on 05/10/2023    [provider]    Current Outpatient Medications  Medication Sig Dispense Refill   BABY ASPIRIN PO Take 160 mg by mouth daily.     Coenzyme Q10-Fish Oil-Vit E  (CO-Q 10 OMEGA-3 FISH OIL PO) Take 2,000 Units by mouth daily.     simvastatin (ZOCOR) 20 MG tablet Take 1 tablet (20 mg total) by mouth daily. 90 tablet 3   EPINEPHrine 0.3 mg/0.3 mL IJ SOAJ injection as directed Injection prn for 30 days As needed (Patient not taking: Reported on 05/10/2023)     Triamcinolone Acetonide (NASACORT ALLERGY 24HR NA) Place into the nose as needed. (Patient not taking: Reported on 05/10/2023)     Current Facility-Administered Medications  Medication Dose Route Frequency Provider Last Rate Last Admin   0.9 %  sodium chloride infusion  500 mL Intravenous Once Dianelly Ferran, Willaim Rayas, MD        Allergies as of 05/10/2023   (No Known Allergies)    Family History  Problem Relation Age of Onset   Heart disease Mother    Heart disease Father    Stroke Brother    Heart disease Brother    Hypertension Brother    Colon cancer Neg Hx    Rectal cancer Neg Hx    Stomach cancer Neg Hx    Esophageal cancer Neg Hx     Social History   Socioeconomic History   Marital status: Married    Spouse name: Not on file   Number of children: Not on file   Years of education: Not on file   Highest education level: Not on file  Occupational  History   Not on file  Tobacco Use   Smoking status: Never   Smokeless tobacco: Never  Vaping Use   Vaping Use: Never used  Substance and Sexual Activity   Alcohol use: Yes    Comment: OCCASIONAL   Drug use: Never   Sexual activity: Yes  Other Topics Concern   Not on file  Social History Narrative   Not on file   Social Determinants of Health   Financial Resource Strain: Low Risk  (03/07/2022)   Overall Financial Resource Strain (CARDIA)    Difficulty of Paying Living Expenses: Not hard at all  Food Insecurity: No Food Insecurity (03/07/2022)   Hunger Vital Sign    Worried About Running Out of Food in the Last Year: Never true    Ran Out of Food in the Last Year: Never true  Transportation Needs: No Transportation Needs  (03/07/2022)   PRAPARE - Administrator, Civil Service (Medical): No    Lack of Transportation (Non-Medical): No  Physical Activity: Sufficiently Active (03/07/2022)   Exercise Vital Sign    Days of Exercise per Week: 3 days    Minutes of Exercise per Session: 110 min  Stress: No Stress Concern Present (03/07/2022)   [employer] of Occupational Health - Occupational Stress Questionnaire    Feeling of Stress : Not at all  Social Connections: Socially Integrated (03/07/2022)   Social Connection and Isolation Panel [NHANES]    Frequency of Communication with Friends and Family: More than three times a week    Frequency of Social Gatherings with Friends and Family: More than three times a week    Attends Religious Services: More than 4 times per year    Active Member of [employer] or Organizations: Yes    Attends Engineer, structural: More than 4 times per year    Marital Status: Married  Catering manager Violence: Not At Risk (03/07/2022)   Humiliation, Afraid, Rape, and Kick questionnaire    Fear of Current or Ex-Partner: No    Emotionally Abused: No    Physically Abused: No    Sexually Abused: No    Review of Systems: All other review of systems negative except as mentioned in the HPI.  Physical Exam: Vital signs BP 117/69   Pulse 63   Temp 98.4 F (36.9 C) (Temporal)   Ht 5\' 6"  (1.676 m)   Wt 152 lb 3.2 oz (69 kg)   SpO2 100%   BMI 24.57 kg/m   General:   Alert,  Well-developed, pleasant and cooperative in NAD Lungs:  Clear throughout to auscultation.   Heart:  Regular rate and rhythm Abdomen:  Soft, nontender and nondistended.   Neuro/Psych:  Alert and cooperative. Normal mood and affect. A and O x 3  Harlin Rain, MD [facility]

## 2023-05-10 NOTE — Patient Instructions (Signed)
Await pathology results.  Resume previous diet. Continue present medications.  Handouts on polyps, diverticulosis, and hemorrhoids provided.  YOU HAD AN ENDOSCOPIC PROCEDURE TODAY AT THE [facility]:   Refer to the procedure report that was given to you for any specific questions about what was found during the examination.  If the procedure report does not answer your questions, please call your gastroenterologist to clarify.  If you requested that your care partner not be given the details of your procedure findings, then the procedure report has been included in a sealed envelope for you to review at your convenience later.  YOU SHOULD EXPECT: Some feelings of bloating in the abdomen. Passage of more gas than usual.  Walking can help get rid of the air that was put into your GI tract during the procedure and reduce the bloating. If you had a lower endoscopy (such as a colonoscopy or flexible sigmoidoscopy) you may notice spotting of blood in your stool or on the toilet paper. If you underwent a bowel prep for your procedure, you may not have a normal bowel movement for a few days.  Please Note:  You might notice some irritation and congestion in your nose or some drainage.  This is from the oxygen used during your procedure.  There is no need for concern and it should clear up in a day or so.  SYMPTOMS TO REPORT IMMEDIATELY:  Following lower endoscopy (colonoscopy or flexible sigmoidoscopy):  Excessive amounts of blood in the stool  Significant tenderness or worsening of abdominal pains  Swelling of the abdomen that is new, acute  Fever of 100F or higher   For urgent or emergent issues, a gastroenterologist can be reached at any hour by calling [phone]. Do not use MyChart messaging for urgent concerns.    DIET:  We do recommend a small meal at first, but then you may proceed to your regular diet.  Drink plenty of fluids but you should avoid alcoholic beverages for 24  hours.  ACTIVITY:  You should plan to take it easy for the rest of today and you should NOT DRIVE or use heavy machinery until tomorrow (because of the sedation medicines used during the test).    FOLLOW UP: Our staff will call the number listed on your records the next business day following your procedure.  We will call around 7:15- 8:00 am to check on you and address any questions or concerns that you may have regarding the information given to you following your procedure. If we do not reach you, we will leave a message.     If any biopsies were taken you will be contacted by phone or by letter within the next 1-3 weeks.  Please call us at [phone] if you have not heard about the biopsies in 3 weeks.    SIGNATURES/CONFIDENTIALITY: You and/or your care partner have signed paperwork which will be entered into your electronic medical record.  These signatures attest to the fact that that the information above on your After Visit Summary has been reviewed and is understood.  Full responsibility of the confidentiality of this discharge information lies with you and/or your care-partner.  

## 2023-05-10 NOTE — Op Note (Signed)
[facility] Patient Name: Franklin [name] Procedure Date: 05/10/2023 9:22 AM MRN: [id number] Endoscopist: Viviann Spare P. Adela Lank , MD, [id number] Age: 67 Referring MD:  Date of Birth: February 16, 1956 Gender: Male Account #: [id number] Procedure:                Colonoscopy Indications:              High risk colon cancer surveillance: Personal                            history of colonic polyps - adenoma removed 04/2016 Medicines:                Monitored Anesthesia Care Procedure:                Pre-Anesthesia Assessment:                           - Prior to the procedure, a History and Physical                            was performed, and patient medications and                            allergies were reviewed. The patient's tolerance of                            previous anesthesia was also reviewed. The risks                            and benefits of the procedure and the sedation                            options and risks were discussed with the patient.                            All questions were answered, and informed consent                            was obtained. Prior Anticoagulants: The patient has                            taken no anticoagulant or antiplatelet agents. ASA                            Grade Assessment: II - A patient with mild systemic                            disease. After reviewing the risks and benefits,                            the patient was deemed in satisfactory condition to                            undergo the procedure.  After obtaining informed consent, the colonoscope                            was passed under direct vision. Throughout the                            procedure, the patient's blood pressure, pulse, and                            oxygen saturations were monitored continuously. The                            [id number]-HQ190L Colonoscope [id number] was introduced                            through  the anus and advanced to the the cecum,                            identified by appendiceal orifice and ileocecal                            valve. The colonoscopy was performed without                            difficulty. The patient tolerated the procedure                            well. The quality of the bowel preparation was                            good. The ileocecal valve, appendiceal orifice, and                            rectum were photographed. Scope In: 9:41:48 AM Scope Out: 10:07:28 AM Scope Withdrawal Time: 0 hours 20 minutes 38 seconds  Total Procedure Duration: 0 hours 25 minutes 40 seconds  Findings:                 The perianal and digital rectal examinations were                            normal.                           A 3 mm polyp was found in the cecum. The polyp was                            sessile. The polyp was removed with a cold snare.                            Resection and retrieval were complete.                           Three sessile polyps were found in the ascending  colon. The polyps were 2 to 4 mm in size. These                            polyps were removed with a cold snare. Resection                            and retrieval were complete.                           Three sessile polyps were found in the transverse                            colon. The polyps were 3 to 4 mm in size. These                            polyps were removed with a cold snare. Resection                            and retrieval were complete.                           A 3 to 4 mm polyp was found in the descending                            colon. The polyp was sessile. The polyp was removed                            with a cold snare. Resection and retrieval were                            complete.                           Multiple small-mouthed diverticula were found in                            the sigmoid colon.                            The colon was tortuous. Pediatric colonoscope used                            for this exam.                           Internal hemorrhoids were found during retroflexion.                           The exam was otherwise without abnormality. Complications:            No immediate complications. Estimated blood loss:                            Minimal. Estimated Blood Loss:     Estimated blood loss was minimal. Impression:               -  One 3 mm polyp in the cecum, removed with a cold                            snare. Resected and retrieved.                           - Three 2 to 4 mm polyps in the ascending colon,                            removed with a cold snare. Resected and retrieved.                           - Three 3 to 4 mm polyps in the transverse colon,                            removed with a cold snare. Resected and retrieved.                           - One 3 to 4 mm polyp in the descending colon,                            removed with a cold snare. Resected and retrieved.                           - Diverticulosis in the sigmoid colon.                           - Tortuous colon.                           - Internal hemorrhoids.                           - The examination was otherwise normal.                           - The GI Genius (intelligent endoscopy module),                            computer-aided polyp detection system powered by AI                            was utilized to detect colorectal polyps through                            enhanced visualization during colonoscopy. Recommendation:           - Patient has a contact number available for                            emergencies. The signs and symptoms of potential                            delayed complications were discussed with the  patient. Return to normal activities tomorrow.                            Written discharge instructions were provided to the                             patient.                           - Resume previous diet.                           - Continue present medications.                           - Await pathology results. Anticipate repeat                            colonoscopy in 3 years given burden of polyps                            removed on this exam. Viviann Spare P. Maryama Kuriakose, MD 05/10/2023 10:12:44 AM This report has been signed electronically.

## 2023-05-11 ENCOUNTER — Telehealth: Payer: Self-pay

## 2023-05-11 NOTE — Telephone Encounter (Signed)
  Follow up Call-     05/10/2023    8:53 AM  Call back number  Post procedure Call Back phone  # [phone]  Permission to leave phone message Yes     Patient questions:  Do you have a fever, pain , or abdominal swelling? No. Pain Score  0 *  Have you tolerated food without any problems? Yes.    Have you been able to return to your normal activities? Yes.    Do you have any questions about your discharge instructions: Diet   No. Medications  No. Follow up visit  No.  Do you have questions or concerns about your Care? No.  Actions: * If pain score is 4 or above: No action needed, pain <4.

## 2023-05-15 DIAGNOSIS — J3089 Other allergic rhinitis: Secondary | ICD-10-CM | POA: Diagnosis not present

## 2023-05-15 DIAGNOSIS — J301 Allergic rhinitis due to pollen: Secondary | ICD-10-CM | POA: Diagnosis not present

## 2023-05-15 DIAGNOSIS — J3081 Allergic rhinitis due to animal (cat) (dog) hair and dander: Secondary | ICD-10-CM | POA: Diagnosis not present

## 2023-05-17 ENCOUNTER — Encounter: Payer: Self-pay | Admitting: Gastroenterology

## 2023-05-24 DIAGNOSIS — J301 Allergic rhinitis due to pollen: Secondary | ICD-10-CM | POA: Diagnosis not present

## 2023-05-24 DIAGNOSIS — J3089 Other allergic rhinitis: Secondary | ICD-10-CM | POA: Diagnosis not present

## 2023-05-24 DIAGNOSIS — J3081 Allergic rhinitis due to animal (cat) (dog) hair and dander: Secondary | ICD-10-CM | POA: Diagnosis not present

## 2023-05-30 DIAGNOSIS — J301 Allergic rhinitis due to pollen: Secondary | ICD-10-CM | POA: Diagnosis not present

## 2023-05-30 DIAGNOSIS — J3081 Allergic rhinitis due to animal (cat) (dog) hair and dander: Secondary | ICD-10-CM | POA: Diagnosis not present

## 2023-05-30 DIAGNOSIS — J3089 Other allergic rhinitis: Secondary | ICD-10-CM | POA: Diagnosis not present

## 2023-06-06 DIAGNOSIS — J3081 Allergic rhinitis due to animal (cat) (dog) hair and dander: Secondary | ICD-10-CM | POA: Diagnosis not present

## 2023-06-06 DIAGNOSIS — J301 Allergic rhinitis due to pollen: Secondary | ICD-10-CM | POA: Diagnosis not present

## 2023-06-06 DIAGNOSIS — J3089 Other allergic rhinitis: Secondary | ICD-10-CM | POA: Diagnosis not present

## 2023-06-13 DIAGNOSIS — J3081 Allergic rhinitis due to animal (cat) (dog) hair and dander: Secondary | ICD-10-CM | POA: Diagnosis not present

## 2023-06-13 DIAGNOSIS — J301 Allergic rhinitis due to pollen: Secondary | ICD-10-CM | POA: Diagnosis not present

## 2023-06-13 DIAGNOSIS — J3089 Other allergic rhinitis: Secondary | ICD-10-CM | POA: Diagnosis not present

## 2023-06-19 DIAGNOSIS — J3081 Allergic rhinitis due to animal (cat) (dog) hair and dander: Secondary | ICD-10-CM | POA: Diagnosis not present

## 2023-06-19 DIAGNOSIS — J301 Allergic rhinitis due to pollen: Secondary | ICD-10-CM | POA: Diagnosis not present

## 2023-06-19 DIAGNOSIS — J3089 Other allergic rhinitis: Secondary | ICD-10-CM | POA: Diagnosis not present

## 2023-07-10 ENCOUNTER — Encounter: Payer: Self-pay | Admitting: Family Medicine

## 2023-07-11 ENCOUNTER — Ambulatory Visit (INDEPENDENT_AMBULATORY_CARE_PROVIDER_SITE_OTHER): Payer: Medicare HMO | Admitting: Physician Assistant

## 2023-07-11 ENCOUNTER — Encounter: Payer: Self-pay | Admitting: Physician Assistant

## 2023-07-11 VITALS — BP 116/70 | HR 73 | Temp 98.4°F | Ht 66.0 in | Wt 157.2 lb

## 2023-07-11 DIAGNOSIS — J301 Allergic rhinitis due to pollen: Secondary | ICD-10-CM | POA: Diagnosis not present

## 2023-07-11 DIAGNOSIS — J3081 Allergic rhinitis due to animal (cat) (dog) hair and dander: Secondary | ICD-10-CM | POA: Diagnosis not present

## 2023-07-11 DIAGNOSIS — J3089 Other allergic rhinitis: Secondary | ICD-10-CM | POA: Diagnosis not present

## 2023-07-11 DIAGNOSIS — R350 Frequency of micturition: Secondary | ICD-10-CM

## 2023-07-11 LAB — POCT URINALYSIS DIPSTICK
Bilirubin, UA: NEGATIVE
Blood, UA: NEGATIVE
Glucose, UA: NEGATIVE
Ketones, UA: NEGATIVE
Leukocytes, UA: NEGATIVE
Nitrite, UA: NEGATIVE
Protein, UA: NEGATIVE
Spec Grav, UA: 1.015 (ref 1.010–1.025)
Urobilinogen, UA: 0.2 E.U./dL
pH, UA: 6.5 (ref 5.0–8.0)

## 2023-07-11 NOTE — Progress Notes (Signed)
Franklin [name] is a 67 y.o. male here for a new problem.  History of Present Illness:   Chief Complaint  Patient presents with   Urinary Frequency    Pt c/o frequency with urination x 4 days, Started last Wednesday, is feeling a little better. Denies fever or chills, no back pain.    HPI  Urinary Frequency: Complains of urinary frequency and lower abdominal tenderness that began 8/7 after golfing the day before.  Reports that he is no longer experiencing symptoms.  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  He wonders if that may be associated with the frequency and tenderness since he has an active lifestyle.  Notes that he does experience constipation, controlled through fiber intake. Had worsening constipation when symptom(s) started. Denies fever, chills, or back pain.    Past Medical History:  Diagnosis Date   GERD (gastroesophageal reflux disease)    Hyperlipidemia      Social History   Tobacco Use   Smoking status: Never   Smokeless tobacco: Never  Vaping Use   Vaping status: Never Used  Substance Use Topics   Alcohol use: Yes    Comment: OCCASIONAL   Drug use: Never    Past Surgical History:  Procedure Laterality Date   APPENDECTOMY     CATARACT     SINUS POLLYPS     TONSILLOCTOMY     UMBILICAL HERNIA REPAIR N/A 11/09/2018   Procedure: HERNIA REPAIR UMBILICAL WITH POSSIBLE MESH ERAS PATHWAY;  Surgeon: Jimmye Norman, MD;  Location: [facility];  Service: General;  Laterality: N/A;    Family History  Problem Relation Age of Onset   Heart disease Mother    Heart disease Father    Stroke Brother    Heart disease Brother    Hypertension Brother    Colon cancer Neg Hx    Rectal cancer Neg Hx    Stomach cancer Neg Hx    Esophageal cancer Neg Hx     No Known Allergies  Current Medications:   Current Outpatient Medications:    BABY ASPIRIN PO, Take 160 mg by mouth daily., Disp: , Rfl:    Coenzyme  Q10-Fish Oil-Vit E (CO-Q 10 OMEGA-3 FISH OIL PO), Take 2,000 Units by mouth daily., Disp: , Rfl:    EPINEPHrine 0.3 mg/0.3 mL IJ SOAJ injection, , Disp: , Rfl:    simvastatin (ZOCOR) 20 MG tablet, Take 1 tablet (20 mg total) by mouth daily., Disp: 90 tablet, Rfl: 3   Triamcinolone Acetonide (NASACORT ALLERGY 24HR NA), Place into the nose as needed., Disp: , Rfl:    Review of Systems:   Review of Systems  Gastrointestinal:        (+) Lower abdominal tenderness  Genitourinary:  Positive for frequency.    Vitals:   Vitals:   07/11/23 1524  BP: 116/70  Pulse: 73  Temp: 98.4 F (36.9 C)  TempSrc: Temporal  SpO2: 99%  Weight: 157 lb 4 oz (71.3 kg)  Height: 5\' 6"  (1.676 m)     Body mass index is 25.38 kg/m.  Physical Exam:   Physical Exam Vitals and nursing note reviewed.  Constitutional:      Appearance: He is well-developed.  HENT:     Head: Normocephalic.  Eyes:     Conjunctiva/sclera: Conjunctivae normal.     Pupils: Pupils are equal, round, and reactive to light.  Pulmonary:     Effort: Pulmonary effort is normal.  Abdominal:  Comments: No tenderness to palpation of abdomen No bulges/masses present  Musculoskeletal:        General: Normal range of motion.     Cervical back: Normal range of motion.  Skin:    General: Skin is warm and dry.  Neurological:     Mental Status: He is alert and oriented to person, place, and time.  Psychiatric:        Behavior: Behavior normal.        Thought Content: Thought content normal.        Judgment: Judgment normal.    Results for orders placed or performed in visit on 07/11/23  POCT urinalysis dipstick  Result Value Ref Range   Color, UA yellow    Clarity, UA clear    Glucose, UA Negative Negative   Bilirubin, UA Negative    Ketones, UA Negative    Spec Grav, UA 1.015 1.010 - 1.025   Blood, UA Negative    pH, UA 6.5 5.0 - 8.0   Protein, UA Negative Negative   Urobilinogen, UA 0.2 0.2 or 1.0 E.U./dL   Nitrite,  UA Negative    Leukocytes, UA Negative Negative   Appearance     Odor       Assessment and Plan:   Frequency of urination No red flags on exam No systemic symptom(s) and vitals stable on my exam Symptom(s) resolved Suspect possible frequency from constipation  Will send off urine for further evaluation for micro and culture If new/worsening symptom(s), needs close follow-up with PCP  I,Emily Lagle,acting as a scribe for [employer], PA.,have documented all relevant documentation on the behalf of [name], PA,as directed by  [name], PA while in the presence of [name], [place].  I, [name], [place], have reviewed all documentation for this visit. The documentation on 07/11/23 for the exam, diagnosis, procedures, and orders are all accurate and complete.   [name], PA-C

## 2023-07-12 LAB — URINALYSIS, MICROSCOPIC ONLY

## 2023-07-24 DIAGNOSIS — L578 Other skin changes due to chronic exposure to nonionizing radiation: Secondary | ICD-10-CM | POA: Diagnosis not present

## 2023-07-24 DIAGNOSIS — D2262 Melanocytic nevi of left upper limb, including shoulder: Secondary | ICD-10-CM | POA: Diagnosis not present

## 2023-07-24 DIAGNOSIS — L821 Other seborrheic keratosis: Secondary | ICD-10-CM | POA: Diagnosis not present

## 2023-07-24 DIAGNOSIS — L814 Other melanin hyperpigmentation: Secondary | ICD-10-CM | POA: Diagnosis not present

## 2023-07-24 DIAGNOSIS — D2271 Melanocytic nevi of right lower limb, including hip: Secondary | ICD-10-CM | POA: Diagnosis not present

## 2023-07-24 DIAGNOSIS — D2371 Other benign neoplasm of skin of right lower limb, including hip: Secondary | ICD-10-CM | POA: Diagnosis not present

## 2023-07-24 DIAGNOSIS — D225 Melanocytic nevi of trunk: Secondary | ICD-10-CM | POA: Diagnosis not present

## 2023-07-26 DIAGNOSIS — Z0100 Encounter for examination of eyes and vision without abnormal findings: Secondary | ICD-10-CM | POA: Diagnosis not present

## 2023-08-02 DIAGNOSIS — J301 Allergic rhinitis due to pollen: Secondary | ICD-10-CM | POA: Diagnosis not present

## 2023-08-02 DIAGNOSIS — J3081 Allergic rhinitis due to animal (cat) (dog) hair and dander: Secondary | ICD-10-CM | POA: Diagnosis not present

## 2023-08-02 DIAGNOSIS — J3089 Other allergic rhinitis: Secondary | ICD-10-CM | POA: Diagnosis not present

## 2023-08-11 ENCOUNTER — Encounter: Payer: Self-pay | Admitting: Family Medicine

## 2023-08-14 ENCOUNTER — Other Ambulatory Visit: Payer: Self-pay | Admitting: *Deleted

## 2023-08-14 DIAGNOSIS — Z8249 Family history of ischemic heart disease and other diseases of the circulatory system: Secondary | ICD-10-CM

## 2023-08-14 DIAGNOSIS — E785 Hyperlipidemia, unspecified: Secondary | ICD-10-CM

## 2023-08-14 NOTE — Telephone Encounter (Signed)
Please advise 

## 2023-08-14 NOTE — Telephone Encounter (Signed)
cardiac calcium score order

## 2023-08-14 NOTE — Telephone Encounter (Signed)
Ok to order cardiac calcium score.  Katina Degree. Jimmey Ralph, MD 08/14/2023 9:18 AM

## 2023-08-23 DIAGNOSIS — J3081 Allergic rhinitis due to animal (cat) (dog) hair and dander: Secondary | ICD-10-CM | POA: Diagnosis not present

## 2023-08-23 DIAGNOSIS — J301 Allergic rhinitis due to pollen: Secondary | ICD-10-CM | POA: Diagnosis not present

## 2023-08-23 DIAGNOSIS — J3089 Other allergic rhinitis: Secondary | ICD-10-CM | POA: Diagnosis not present

## 2023-09-04 ENCOUNTER — Ambulatory Visit (HOSPITAL_BASED_OUTPATIENT_CLINIC_OR_DEPARTMENT_OTHER)
Admission: RE | Admit: 2023-09-04 | Discharge: 2023-09-04 | Disposition: A | Discharge status: Home / Self Care | Payer: Medicare HMO | Source: Ambulatory Visit | Attending: Family Medicine | Admitting: Family Medicine

## 2023-09-04 DIAGNOSIS — Z8249 Family history of ischemic heart disease and other diseases of the circulatory system: Secondary | ICD-10-CM | POA: Insufficient documentation

## 2023-09-05 NOTE — Progress Notes (Signed)
His calcium score is 1243.  This puts him at the 89th percentile.  This is not a urgent concern however we do need to be more aggressive with his risk factor management.  He probably should be on a higher intensity statin however it would be a good idea for him to see a cardiologist for ongoing surveillance.  Please place referral if he is agreeable.

## 2023-09-06 ENCOUNTER — Telehealth: Payer: Self-pay | Admitting: Family Medicine

## 2023-09-06 ENCOUNTER — Other Ambulatory Visit: Payer: Self-pay | Admitting: *Deleted

## 2023-09-06 DIAGNOSIS — R931 Abnormal findings on diagnostic imaging of heart and coronary circulation: Secondary | ICD-10-CM

## 2023-09-06 NOTE — Telephone Encounter (Signed)
Patient returned call. Requests to be called. 

## 2023-09-06 NOTE — Telephone Encounter (Signed)
See results note. 

## 2023-09-19 DIAGNOSIS — J3089 Other allergic rhinitis: Secondary | ICD-10-CM | POA: Diagnosis not present

## 2023-09-19 DIAGNOSIS — J3081 Allergic rhinitis due to animal (cat) (dog) hair and dander: Secondary | ICD-10-CM | POA: Diagnosis not present

## 2023-09-19 DIAGNOSIS — J301 Allergic rhinitis due to pollen: Secondary | ICD-10-CM | POA: Diagnosis not present

## 2023-10-18 DIAGNOSIS — J3089 Other allergic rhinitis: Secondary | ICD-10-CM | POA: Diagnosis not present

## 2023-10-18 DIAGNOSIS — J301 Allergic rhinitis due to pollen: Secondary | ICD-10-CM | POA: Diagnosis not present

## 2023-10-18 DIAGNOSIS — J3081 Allergic rhinitis due to animal (cat) (dog) hair and dander: Secondary | ICD-10-CM | POA: Diagnosis not present

## 2023-10-20 ENCOUNTER — Encounter: Payer: Self-pay | Admitting: Internal Medicine

## 2023-10-30 NOTE — Progress Notes (Unsigned)
  Cardiology Office Note:  .   Date:  10/30/2023  ID:  Franklin [name], DOB 10-14-1956, MRN [id number] PCP: Ardith Dark, MD  [facility] Providers Cardiologist:  None { Click to update primary MD,subspecialty MD or APP then REFRESH:1}   History of Present Illness: .   Franklin [name] is a 67 y.o. male *** referral for CAC 1243, mainly in the RCA which is 89th percentile.  ROS: ***  Studies Reviewed: .        *** Risk Assessment/Calculations:   {Does this patient have ATRIAL FIBRILLATION?:[phone]} No BP recorded.  {Refresh Note OR Click here to enter BP  :1}***       Physical Exam:   VS:  There were no vitals taken for this visit.   Wt Readings from Last 3 Encounters:  07/11/23 157 lb 4 oz (71.3 kg)  05/10/23 152 lb 3.2 oz (69 kg)  04/25/23 155 lb (70.3 kg)    GEN: Well nourished, well developed in no acute distress NECK: No JVD; No carotid bruits CARDIAC: ***RRR, no murmurs, rubs, gallops RESPIRATORY:  Clear to auscultation without rales, wheezing or rhonchi  ABDOMEN: Soft, non-tender, non-distended EXTREMITIES:  No edema; No deformity   ASSESSMENT AND PLAN: .   CAC Very elevated Change simvastatin 20 mg daily to crestor 20 mg daily Add aspirin Fasting lipid profile, Lp(a) LHC,, TTE    {Are you ordering a CV Procedure (e.g. stress test, cath, DCCV, TEE, etc)?   Press F2        :[id number]}  Dispo: ***  Signed, Maisie Fus, MD

## 2023-10-31 ENCOUNTER — Encounter: Payer: Self-pay | Admitting: Internal Medicine

## 2023-10-31 ENCOUNTER — Ambulatory Visit: Payer: Medicare HMO | Attending: Internal Medicine | Admitting: Internal Medicine

## 2023-10-31 ENCOUNTER — Ambulatory Visit (INDEPENDENT_AMBULATORY_CARE_PROVIDER_SITE_OTHER): Payer: Medicare HMO

## 2023-10-31 VITALS — BP 136/64 | HR 52 | Ht 66.0 in | Wt 155.4 lb

## 2023-10-31 DIAGNOSIS — E785 Hyperlipidemia, unspecified: Secondary | ICD-10-CM

## 2023-10-31 DIAGNOSIS — R931 Abnormal findings on diagnostic imaging of heart and coronary circulation: Secondary | ICD-10-CM | POA: Diagnosis not present

## 2023-10-31 NOTE — Patient Instructions (Signed)
Medication Instructions:  No changes  *If you need a refill on your cardiac medications before your next appointment, please call your pharmacy*   Lab Work: LPa If you have labs (blood work) drawn today and your tests are completely normal, you will receive your results only by: MyChart Message (if you have MyChart) OR A paper copy in the mail If you have any lab test that is abnormal or we need to change your treatment, we will call you to review the results.   Testing/Procedures: Echo to be scheduled next available Your physician has requested that you have an echocardiogram. Echocardiography is a painless test that uses sound waves to create images of your heart. It provides your doctor with information about the size and shape of your heart and how well your heart's chambers and valves are working. This procedure takes approximately one hour. There are no restrictions for this procedure. Please do NOT wear cologne, perfume, aftershave, or lotions (deodorant is allowed). Please arrive 15 minutes prior to your appointment time.  Please note: We ask at that you not bring children with you during ultrasound (echo/ vascular) testing. Due to room size and safety concerns, children are not allowed in the ultrasound rooms during exams. Our front office staff cannot provide observation of children in our lobby area while testing is being conducted. An adult accompanying a patient to their appointment will only be allowed in the ultrasound room at the discretion of the ultrasound technician under special circumstances. We apologize for any inconvenience.   Exercise Myoview Your physician has requested that you have en exercise stress myoview. For further information please visit [email or web address] Please follow instruction sheet, as given.    Follow-Up: At [facility], you and your health needs are our priority.  As part of our continuing mission to provide you with exceptional heart  care, we have created designated Provider Care Teams.  These Care Teams include your primary Cardiologist (physician) and Advanced Practice Providers (APPs -  Physician Assistants and Nurse Practitioners) who all work together to provide you with the care you need, when you need it.  We recommend signing up for the patient portal called "MyChart".  Sign up information is provided on this After Visit Summary.  MyChart is used to connect with patients for Virtual Visits (Telemedicine).  Patients are able to view lab/test results, encounter notes, upcoming appointments, etc.  Non-urgent messages can be sent to your provider as well.   To learn more about what you can do with MyChart, go to [email or web address].    Your next appointment:   6 month(s)  Provider:   Dr.Mary Branch     Other Instructions ZIO XT- Long Term Monitor Instructions  Your physician has requested you wear a ZIO patch monitor for 14 days.  This is a single patch monitor. [employer] supplies one patch monitor per enrollment. Additional stickers are not available. Please do not apply patch if you will be having a Nuclear Stress Test,  Echocardiogram, Cardiac CT, MRI, or Chest Xray during the period you would be wearing the  monitor. The patch cannot be worn during these tests. You cannot remove and re-apply the  ZIO XT patch monitor.  Your ZIO patch monitor will be mailed 3 day USPS to your address on file. It may take 3-5 days  to receive your monitor after you have been enrolled.  Once you have received your monitor, please review the enclosed instructions. Your monitor  has already been registered assigning a  specific monitor serial # to you.  Billing and Patient Assistance Program Information  We have supplied [employer] with any of your insurance information on file for billing purposes. [employer] offers a sliding scale Patient Assistance Program for patients that do not have  insurance, or whose insurance does not  completely cover the cost of the ZIO monitor.  You must apply for the Patient Assistance Program to qualify for this discounted rate.  To apply, please call [employer] at [phone], select option 4, select option 2, ask to apply for  Patient Assistance Program. Meredeth Ide will ask your household income, and how many people  are in your household. They will quote your out-of-pocket cost based on that information.  [employer] will also be able to set up a 66-month, interest-free payment plan if needed.  Applying the monitor   Shave hair from upper left chest.  Hold abrader disc by orange tab. Rub abrader in 40 strokes over the upper left chest as  indicated in your monitor instructions.  Clean area with 4 enclosed alcohol pads. Let dry.  Apply patch as indicated in monitor instructions. Patch will be placed under collarbone on left  side of chest with arrow pointing upward.  Rub patch adhesive wings for 2 minutes. Remove white label marked "1". Remove the white  label marked "2". Rub patch adhesive wings for 2 additional minutes.  While looking in a mirror, press and release button in center of patch. A small green light will  flash 3-4 times. This will be your only indicator that the monitor has been turned on.  Do not shower for the first 24 hours. You may shower after the first 24 hours.  Press the button if you feel a symptom. You will hear a small click. Record Date, Time and  Symptom in the Patient Logbook.  When you are ready to remove the patch, follow instructions on the last 2 pages of Patient  Logbook. Stick patch monitor onto the last page of Patient Logbook.  Place Patient Logbook in the blue and white box. Use locking tab on box and tape box closed  securely. The blue and white box has prepaid postage on it. Please place it in the mailbox as  soon as possible. Your physician should have your test results approximately 7 days after the  monitor has been mailed back to [facility].  Call  [facility] Customer Care at [phone] if you have questions regarding  your ZIO XT patch monitor. Call them immediately if you see an orange light blinking on your  monitor.  If your monitor falls off in less than 4 days, contact our Monitor department at [phone].  If your monitor becomes loose or falls off after 4 days call [employer] at [phone] for  suggestions on securing your monitor

## 2023-10-31 NOTE — Progress Notes (Unsigned)
Enrolled patient for a 7 day Zio XT monitor to be mailed to patients home.  

## 2023-11-01 ENCOUNTER — Ambulatory Visit (HOSPITAL_COMMUNITY)
Admission: RE | Admit: 2023-11-01 | Discharge: 2023-11-01 | Disposition: A | Discharge status: Home / Self Care | Payer: Medicare HMO | Source: Ambulatory Visit | Attending: Cardiovascular Disease | Admitting: Cardiovascular Disease

## 2023-11-01 ENCOUNTER — Telehealth (HOSPITAL_COMMUNITY): Payer: Self-pay | Admitting: *Deleted

## 2023-11-01 DIAGNOSIS — J301 Allergic rhinitis due to pollen: Secondary | ICD-10-CM | POA: Diagnosis not present

## 2023-11-01 DIAGNOSIS — E785 Hyperlipidemia, unspecified: Secondary | ICD-10-CM | POA: Diagnosis not present

## 2023-11-01 DIAGNOSIS — J3081 Allergic rhinitis due to animal (cat) (dog) hair and dander: Secondary | ICD-10-CM | POA: Diagnosis not present

## 2023-11-01 DIAGNOSIS — Z79899 Other long term (current) drug therapy: Secondary | ICD-10-CM | POA: Diagnosis not present

## 2023-11-01 DIAGNOSIS — R002 Palpitations: Secondary | ICD-10-CM | POA: Diagnosis not present

## 2023-11-01 DIAGNOSIS — J3089 Other allergic rhinitis: Secondary | ICD-10-CM | POA: Diagnosis not present

## 2023-11-01 DIAGNOSIS — R931 Abnormal findings on diagnostic imaging of heart and coronary circulation: Secondary | ICD-10-CM | POA: Insufficient documentation

## 2023-11-01 LAB — ECHOCARDIOGRAM COMPLETE
AR max vel: 2.02 cm2
AV Area VTI: 1.84 cm2
AV Area mean vel: 1.84 cm2
AV Mean grad: 4 mm[Hg]
AV Peak grad: 7.3 mm[Hg]
Ao pk vel: 1.35 m/s
Area-P 1/2: 3.77 cm2
MV M vel: 2.45 m/s
MV Peak grad: 24 mm[Hg]
S' Lateral: 3.12 cm

## 2023-11-01 LAB — LIPOPROTEIN A (LPA): Lipoprotein (a): 135 nmol/L — ABNORMAL HIGH (ref <75.0)

## 2023-11-01 NOTE — Telephone Encounter (Signed)
Patient given detailed instructions per Myocardial Perfusion Study Information Sheet for the test on 11/06/2023 at 10:45. Patient notified to arrive 15 minutes early and that it is imperative to arrive on time for appointment to keep from having the test rescheduled.  If you need to cancel or reschedule your appointment, please call the office within 24 hours of your appointment. . Patient verbalized understanding.Daneil Dolin

## 2023-11-06 ENCOUNTER — Ambulatory Visit (HOSPITAL_COMMUNITY): Payer: Medicare HMO | Attending: Cardiology

## 2023-11-06 DIAGNOSIS — Z136 Encounter for screening for cardiovascular disorders: Secondary | ICD-10-CM | POA: Insufficient documentation

## 2023-11-06 DIAGNOSIS — Z7982 Long term (current) use of aspirin: Secondary | ICD-10-CM | POA: Diagnosis not present

## 2023-11-06 DIAGNOSIS — R931 Abnormal findings on diagnostic imaging of heart and coronary circulation: Secondary | ICD-10-CM

## 2023-11-06 DIAGNOSIS — Z79899 Other long term (current) drug therapy: Secondary | ICD-10-CM | POA: Diagnosis not present

## 2023-11-06 DIAGNOSIS — E785 Hyperlipidemia, unspecified: Secondary | ICD-10-CM | POA: Diagnosis not present

## 2023-11-06 DIAGNOSIS — Z83438 Family history of other disorder of lipoprotein metabolism and other lipidemia: Secondary | ICD-10-CM | POA: Diagnosis not present

## 2023-11-06 DIAGNOSIS — Z8249 Family history of ischemic heart disease and other diseases of the circulatory system: Secondary | ICD-10-CM | POA: Diagnosis not present

## 2023-11-06 DIAGNOSIS — I2510 Atherosclerotic heart disease of native coronary artery without angina pectoris: Secondary | ICD-10-CM | POA: Diagnosis not present

## 2023-11-06 LAB — MYOCARDIAL PERFUSION IMAGING
Angina Index: 0
Duke Treadmill Score: 11
Estimated workload: 12.9
Exercise duration (min): 10 min
Exercise duration (sec): 45 s
LV dias vol: 78 mL (ref 62–150)
LV sys vol: 27 mL
MPHR: 153 {beats}/min
Nuc Stress EF: 65 %
Peak HR: 133 {beats}/min
Percent HR: 86 %
RPE: 18
Rest HR: 48 {beats}/min
Rest Nuclear Isotope Dose: 10.2 mCi
SDS: 1
SRS: 0
SSS: 1
ST Depression (mm): 0 mm
Stress Nuclear Isotope Dose: 32.1 mCi
TID: 0.84

## 2023-11-06 MED ORDER — TECHNETIUM TC 99M TETROFOSMIN IV KIT: 32.1000 | PACK | Freq: Once | INTRAVENOUS | Status: AC | PRN

## 2023-11-06 MED ORDER — TECHNETIUM TC 99M TETROFOSMIN IV KIT
10.2000 | PACK | Freq: Once | INTRAVENOUS | Status: AC | PRN
  Administered 2023-11-06: 10.2 via INTRAVENOUS

## 2023-11-06 MED ORDER — REGADENOSON 0.4 MG/5ML IV SOLN: 0.4000 mg | Freq: Once | INTRAVENOUS | Status: AC

## 2023-11-09 DIAGNOSIS — R931 Abnormal findings on diagnostic imaging of heart and coronary circulation: Secondary | ICD-10-CM

## 2023-11-09 DIAGNOSIS — E785 Hyperlipidemia, unspecified: Secondary | ICD-10-CM

## 2023-11-15 DIAGNOSIS — J3089 Other allergic rhinitis: Secondary | ICD-10-CM | POA: Diagnosis not present

## 2023-11-15 DIAGNOSIS — J3081 Allergic rhinitis due to animal (cat) (dog) hair and dander: Secondary | ICD-10-CM | POA: Diagnosis not present

## 2023-11-15 DIAGNOSIS — J301 Allergic rhinitis due to pollen: Secondary | ICD-10-CM | POA: Diagnosis not present

## 2023-12-05 DIAGNOSIS — H5213 Myopia, bilateral: Secondary | ICD-10-CM | POA: Diagnosis not present

## 2023-12-13 DIAGNOSIS — J3081 Allergic rhinitis due to animal (cat) (dog) hair and dander: Secondary | ICD-10-CM | POA: Diagnosis not present

## 2023-12-13 DIAGNOSIS — J301 Allergic rhinitis due to pollen: Secondary | ICD-10-CM | POA: Diagnosis not present

## 2023-12-13 DIAGNOSIS — J3089 Other allergic rhinitis: Secondary | ICD-10-CM | POA: Diagnosis not present

## 2023-12-18 DIAGNOSIS — J3089 Other allergic rhinitis: Secondary | ICD-10-CM | POA: Diagnosis not present

## 2023-12-18 DIAGNOSIS — J301 Allergic rhinitis due to pollen: Secondary | ICD-10-CM | POA: Diagnosis not present

## 2023-12-18 DIAGNOSIS — J3081 Allergic rhinitis due to animal (cat) (dog) hair and dander: Secondary | ICD-10-CM | POA: Diagnosis not present

## 2023-12-18 DIAGNOSIS — J339 Nasal polyp, unspecified: Secondary | ICD-10-CM | POA: Diagnosis not present

## 2023-12-19 ENCOUNTER — Ambulatory Visit (INDEPENDENT_AMBULATORY_CARE_PROVIDER_SITE_OTHER): Payer: Medicare HMO | Admitting: Family Medicine

## 2023-12-19 VITALS — BP 129/75 | HR 57 | Temp 97.2°F | Ht 66.0 in | Wt 157.4 lb

## 2023-12-19 DIAGNOSIS — E785 Hyperlipidemia, unspecified: Secondary | ICD-10-CM

## 2023-12-19 DIAGNOSIS — K5900 Constipation, unspecified: Secondary | ICD-10-CM | POA: Insufficient documentation

## 2023-12-19 DIAGNOSIS — Z131 Encounter for screening for diabetes mellitus: Secondary | ICD-10-CM

## 2023-12-19 DIAGNOSIS — J302 Other seasonal allergic rhinitis: Secondary | ICD-10-CM

## 2023-12-19 DIAGNOSIS — Z0001 Encounter for general adult medical examination with abnormal findings: Secondary | ICD-10-CM | POA: Diagnosis not present

## 2023-12-19 DIAGNOSIS — K5901 Slow transit constipation: Secondary | ICD-10-CM | POA: Diagnosis not present

## 2023-12-19 DIAGNOSIS — Z125 Encounter for screening for malignant neoplasm of prostate: Secondary | ICD-10-CM | POA: Diagnosis not present

## 2023-12-19 DIAGNOSIS — R931 Abnormal findings on diagnostic imaging of heart and coronary circulation: Secondary | ICD-10-CM | POA: Insufficient documentation

## 2023-12-19 LAB — COMPREHENSIVE METABOLIC PANEL
ALT: 21 U/L (ref 0–53)
AST: 28 U/L (ref 0–37)
Albumin: 4.9 g/dL (ref 3.5–5.2)
Alkaline Phosphatase: 68 U/L (ref 39–117)
BUN: 13 mg/dL (ref 6–23)
CO2: 29 meq/L (ref 19–32)
Calcium: 9.6 mg/dL (ref 8.4–10.5)
Chloride: 104 meq/L (ref 96–112)
Creatinine, Ser: 0.76 mg/dL (ref 0.40–1.50)
GFR: 92.62 mL/min (ref >60.00)
Glucose, Bld: 111 mg/dL — ABNORMAL HIGH (ref 70–99)
Potassium: 5.4 meq/L — ABNORMAL HIGH (ref 3.5–5.1)
Sodium: 141 meq/L (ref 135–145)
Total Bilirubin: 0.9 mg/dL (ref 0.2–1.2)
Total Protein: 7.5 g/dL (ref 6.0–8.3)

## 2023-12-19 LAB — LIPID PANEL
Cholesterol: 148 mg/dL (ref 0–200)
HDL: 52.8 mg/dL (ref >39.00)
LDL Cholesterol: 70 mg/dL (ref 0–99)
NonHDL: 95.14
Total CHOL/HDL Ratio: 3
Triglycerides: 124 mg/dL (ref 0.0–149.0)
VLDL: 24.8 mg/dL (ref 0.0–40.0)

## 2023-12-19 LAB — CBC
HCT: 43.3 % (ref 39.0–52.0)
Hemoglobin: 14.8 g/dL (ref 13.0–17.0)
MCHC: 34.3 g/dL (ref 30.0–36.0)
MCV: 97.9 fL (ref 78.0–100.0)
Platelets: 193 10*3/uL (ref 150.0–400.0)
RBC: 4.42 Mil/uL (ref 4.22–5.81)
RDW: 12.6 % (ref 11.5–15.5)
WBC: 7.9 10*3/uL (ref 4.0–10.5)

## 2023-12-19 LAB — PSA: PSA: 2.11 ng/mL (ref 0.10–4.00)

## 2023-12-19 LAB — TSH: TSH: 2.04 u[IU]/mL (ref 0.35–5.50)

## 2023-12-19 LAB — HEMOGLOBIN A1C: Hgb A1c MFr Bld: 6 % (ref 4.6–6.5)

## 2023-12-19 MED ORDER — SIMVASTATIN 20 MG PO TABS: 20.0000 mg | ORAL_TABLET | Freq: Every day | ORAL | 3 refills | Status: DC

## 2023-12-19 NOTE — Assessment & Plan Note (Signed)
Following with cardiology now.  His recent workup was all reassuring.  We discussed importance of aggressive risk factor modification.  Last LDL was 65.  Will recheck lipids today.  Continue simvastatin 20 mg daily.  He is also on baby aspirin daily as well.

## 2023-12-19 NOTE — Assessment & Plan Note (Signed)
Symptoms currently manageable.  We did discuss adequate hydration and fiber supplementation which she is already doing.  It is okay for him to take daily MiraLAX as needed.  He will let us know if symptoms do not improve.

## 2023-12-19 NOTE — Assessment & Plan Note (Signed)
Along with her allergist.  Using Nasacort daily.  Overall symptoms are stable.

## 2023-12-19 NOTE — Assessment & Plan Note (Signed)
Check lipids.  He is doing well on simvastatin 20 mg daily.

## 2023-12-19 NOTE — Progress Notes (Signed)
Chief Complaint:  Franklin [name] is a 68 y.o. male who presents today for his annual comprehensive physical exam.    Assessment/Plan:  Chronic Problems Addressed Today: Constipation Symptoms currently manageable.  We did discuss adequate hydration and fiber supplementation which she is already doing.  It is okay for him to take daily MiraLAX as needed.  He will let us know if symptoms do not improve.  Dyslipidemia Check lipids.  He is doing well on simvastatin 20 mg daily.  Seasonal allergies Along with her allergist.  Using Nasacort daily.  Overall symptoms are stable.  Elevated coronary artery calcium score Following with cardiology now.  His recent workup was all reassuring.  We discussed importance of aggressive risk factor modification.  Last LDL was 65.  Will recheck lipids today.  Continue simvastatin 20 mg daily.  He is also on baby aspirin daily as well.   Preventative Healthcare: Check labs.  He is deferring pneumonia vaccine until he is 29.  Patient Counseling(The following topics were reviewed and/or handout was given):  -Nutrition: Stressed importance of moderation in sodium/caffeine intake, saturated fat and cholesterol, caloric balance, sufficient intake of fresh fruits, vegetables, and fiber.  -Stressed the importance of regular exercise.   -Substance Abuse: Discussed cessation/primary prevention of tobacco, alcohol, or other drug use; driving or other dangerous activities under the influence; availability of treatment for abuse.   -Injury prevention: Discussed safety belts, safety helmets, smoke detector, smoking near bedding or upholstery.   -Sexuality: Discussed sexually transmitted diseases, partner selection, use of condoms, avoidance of unintended pregnancy and contraceptive alternatives.   -Dental health: Discussed importance of regular tooth brushing, flossing, and dental visits.  -Health maintenance and immunizations reviewed. Please refer to Health  maintenance section.  Return to care in 1 year for next preventative visit.     Subjective:  HPI:  He has no acute complaints today. See Assessment / plan for status of chronic conditions. He has been having more constipation recently. Has started miralax a few days ago and has been doing well with this.   Lifestyle Diet: Balanced. Plenty of fruits and vegetables.  Exercise: Golf and pickleball routinely.      12/19/2023    9:14 AM  Depression screen PHQ 2/9  Decreased Interest 0  Down, Depressed, Hopeless 0  PHQ - 2 Score 0    Health Maintenance Due  Topic Date Due   DTaP/Tdap/Td (1 - Tdap) Never done   Medicare Annual Wellness (AWV)  03/08/2023     ROS: Per HPI, otherwise a complete review of systems was negative.   PMH:  The following were reviewed and entered/updated in epic: Past Medical History:  Diagnosis Date   GERD (gastroesophageal reflux disease)    Hyperlipidemia    Patient Active Problem List   Diagnosis Date Noted   Constipation 12/19/2023   Elevated coronary artery calcium score 12/19/2023   Inguinal hernia 12/14/2022   Recurrent sinusitis 01/06/2022   Seasonal allergies 05/21/2018   Dyslipidemia 05/21/2018   Family history of early CAD 05/24/2016   Hyperlipidemia, unspecified 05/24/2016   Past Surgical History:  Procedure Laterality Date   APPENDECTOMY     CATARACT     SINUS POLLYPS     TONSILLOCTOMY     UMBILICAL HERNIA REPAIR N/A 11/09/2018   Procedure: HERNIA REPAIR UMBILICAL WITH POSSIBLE MESH ERAS PATHWAY;  Surgeon: Jimmye Norman, MD;  Location: [facility];  Service: General;  Laterality: N/A;    Family History  Problem Relation Age of  Onset   Heart disease Mother    Heart disease Father    Stroke Brother    Heart disease Brother    Hypertension Brother    Colon cancer Neg Hx    Rectal cancer Neg Hx    Stomach cancer Neg Hx    Esophageal cancer Neg Hx     Medications- reviewed and updated Current Outpatient  Medications  Medication Sig Dispense Refill   BABY ASPIRIN PO Take 160 mg by mouth daily.     Coenzyme Q10-Fish Oil-Vit E (CO-Q 10 OMEGA-3 FISH OIL PO) Take 2,000 Units by mouth daily.     EPINEPHrine 0.3 mg/0.3 mL IJ SOAJ injection      Triamcinolone Acetonide (NASACORT ALLERGY 24HR NA) Place into the nose as needed.     simvastatin (ZOCOR) 20 MG tablet Take 1 tablet (20 mg total) by mouth daily. 90 tablet 3   No current facility-administered medications for this visit.   Facility-Administered Medications Ordered in Other Visits  Medication Dose Route Frequency Provider Last Rate Last Admin   regadenoson (LEXISCAN) injection SOLN 0.4 mg  0.4 mg Intravenous Once Jodelle Red, MD       technetium tetrofosmin (TC-MYOVIEW) injection 32.1 millicurie  32.1 millicurie Intravenous Once PRN Jodelle Red, MD        Allergies-reviewed and updated No Known Allergies  Social History   Socioeconomic History   Marital status: Married    Spouse name: Not on file   Number of children: Not on file   Years of education: Not on file   Highest education level: Not on file  Occupational History   Not on file  Tobacco Use   Smoking status: Never   Smokeless tobacco: Never  Vaping Use   Vaping status: Never Used  Substance and Sexual Activity   Alcohol use: Yes    Comment: OCCASIONAL   Drug use: Never   Sexual activity: Yes  Other Topics Concern   Not on file  Social History Narrative   Not on file   Social Drivers of Health   Financial Resource Strain: Low Risk  (03/07/2022)   Overall Financial Resource Strain (CARDIA)    Difficulty of Paying Living Expenses: Not hard at all  Food Insecurity: No Food Insecurity (03/07/2022)   Hunger Vital Sign    Worried About Running Out of Food in the Last Year: Never true    Ran Out of Food in the Last Year: Never true  Transportation Needs: No Transportation Needs (03/07/2022)   PRAPARE - Administrator, Civil Service  (Medical): No    Lack of Transportation (Non-Medical): No  Physical Activity: Sufficiently Active (03/07/2022)   Exercise Vital Sign    Days of Exercise per Week: 3 days    Minutes of Exercise per Session: 110 min  Stress: No Stress Concern Present (03/07/2022)   [employer] of Occupational Health - Occupational Stress Questionnaire    Feeling of Stress : Not at all  Social Connections: Socially Integrated (03/07/2022)   Social Connection and Isolation Panel [NHANES]    Frequency of Communication with Friends and Family: More than three times a week    Frequency of Social Gatherings with Friends and Family: More than three times a week    Attends Religious Services: More than 4 times per year    Active Member of [employer] or Organizations: Yes    Attends Engineer, structural: More than 4 times per year    Marital Status: Married  Objective:  Physical Exam: BP 129/75   Pulse (!) 57   Temp (!) 97.2 F (36.2 C) (Temporal)   Ht 5\' 6"  (1.676 m)   Wt 157 lb 6.4 oz (71.4 kg)   SpO2 100%   BMI 25.41 kg/m   Body mass index is 25.41 kg/m. Wt Readings from Last 3 Encounters:  12/19/23 157 lb 6.4 oz (71.4 kg)  11/06/23 155 lb (70.3 kg)  10/31/23 155 lb 6.4 oz (70.5 kg)   Gen: NAD, resting comfortably HEENT: TMs normal bilaterally. OP clear. No thyromegaly noted.  CV: RRR with no murmurs appreciated Pulm: NWOB, CTAB with no crackles, wheezes, or rhonchi GI: Normal bowel sounds present. Soft, Nontender, Nondistended. MSK: no edema, cyanosis, or clubbing noted Skin: warm, dry Neuro: CN2-12 grossly intact. Strength 5/5 in upper and lower extremities. Reflexes symmetric and intact bilaterally.  Psych: Normal affect and thought content     Ayeisha Lindenberger M. Jimmey Ralph, MD 12/19/2023 9:51 AM

## 2023-12-19 NOTE — Patient Instructions (Signed)
It was very nice to see you today!  We will check blood work today.  Please keep up the great work with your diet and exercise.  You can continue taking MiraLAX daily as needed to have at least 1-2 soft bowel movements per day.  Return in about 1 year (around 12/18/2024) for Annual Physical.   Take care, Dr Jimmey Ralph  PLEASE NOTE:  If you had any lab tests, please let us know if you have not heard back within a few days. You may see your results on mychart before we have a chance to review them but we will give you a call once they are reviewed by [place].   If we ordered any referrals today, please let us know if you have not heard from their office within the next week.   If you had any urgent prescriptions sent in today, please check with the pharmacy within an hour of our visit to make sure the prescription was transmitted appropriately.   Please try these tips to maintain a healthy lifestyle:  Eat at least 3 REAL meals and 1-2 snacks per day.  Aim for no more than 5 hours between eating.  If you eat breakfast, please do so within one hour of getting up.   Each meal should contain half fruits/vegetables, one quarter protein, and one quarter carbs (no bigger than a computer mouse)  Cut down on sweet beverages. This includes juice, soda, and sweet tea.   Drink at least 1 glass of water with each meal and aim for at least 8 glasses per day  Exercise at least 150 minutes every week.    Preventive Care 77 Years and Older, Male Preventive care refers to lifestyle choices and visits with your health care provider that can promote health and wellness. Preventive care visits are also called wellness exams. What can I expect for my preventive care visit? Counseling During your preventive care visit, your health care provider may ask about your: Medical history, including: Past medical problems. Family medical history. History of falls. Current health, including: Emotional  well-being. Home life and relationship well-being. Sexual activity. Memory and ability to understand (cognition). Lifestyle, including: Alcohol, nicotine or tobacco, and drug use. Access to firearms. Diet, exercise, and sleep habits. Work and work Astronomer. Sunscreen use. Safety issues such as seatbelt and bike helmet use. Physical exam Your health care provider will check your: Height and weight. These may be used to calculate your BMI (body mass index). BMI is a measurement that tells if you are at a healthy weight. Waist circumference. This measures the distance around your waistline. This measurement also tells if you are at a healthy weight and may help predict your risk of certain diseases, such as type 2 diabetes and high blood pressure. Heart rate and blood pressure. Body temperature. Skin for abnormal spots. What immunizations do I need?  Vaccines are usually given at various ages, according to a schedule. Your health care provider will recommend vaccines for you based on your age, medical history, and lifestyle or other factors, such as travel or where you work. What tests do I need? Screening Your health care provider may recommend screening tests for certain conditions. This may include: Lipid and cholesterol levels. Diabetes screening. This is done by checking your blood sugar (glucose) after you have not eaten for a while (fasting). Hepatitis C test. Hepatitis B test. HIV (human immunodeficiency virus) test. STI (sexually transmitted infection) testing, if you are at risk. Lung cancer screening. Colorectal  cancer screening. Prostate cancer screening. Abdominal aortic aneurysm (AAA) screening. You may need this if you are a current or former smoker. Talk with your health care provider about your test results, treatment options, and if necessary, the need for more tests. Follow these instructions at home: Eating and drinking  Eat a diet that includes fresh fruits  and vegetables, whole grains, lean protein, and low-fat dairy products. Limit your intake of foods with high amounts of sugar, saturated fats, and salt. Take vitamin and mineral supplements as recommended by your health care provider. Do not drink alcohol if your health care provider tells you not to drink. If you drink alcohol: Limit how much you have to 0-2 drinks a day. Know how much alcohol is in your drink. In the [place] one drink equals one 12 oz bottle of beer (355 mL), one 5 oz glass of wine (148 mL), or one 1 oz glass of hard liquor (44 mL). Lifestyle Brush your teeth every morning and night with fluoride toothpaste. Floss one time each day. Exercise for at least 30 minutes 5 or more days each week. Do not use any products that contain nicotine or tobacco. These products include cigarettes, chewing tobacco, and vaping devices, such as e-cigarettes. If you need help quitting, ask your health care provider. Do not use drugs. If you are sexually active, practice safe sex. Use a condom or other form of protection to prevent STIs. Take aspirin only as told by your health care provider. Make sure that you understand how much to take and what form to take. Work with your health care provider to find out whether it is safe and beneficial for you to take aspirin daily. Ask your health care provider if you need to take a cholesterol-lowering medicine (statin). Find healthy ways to manage stress, such as: Meditation, yoga, or listening to music. Journaling. Talking to a trusted person. Spending time with friends and family. Safety Always wear your seat belt while driving or riding in a vehicle. Do not drive: If you have been drinking alcohol. Do not ride with someone who has been drinking. When you are tired or distracted. While texting. If you have been using any mind-altering substances or drugs. Wear a helmet and other protective equipment during sports activities. If you have firearms in  your house, make sure you follow all gun safety procedures. Minimize exposure to UV radiation to reduce your risk of skin cancer. What's next? Visit your health care provider once a year for an annual wellness visit. Ask your health care provider how often you should have your eyes and teeth checked. Stay up to date on all vaccines. This information is not intended to replace advice given to you by your health care provider. Make sure you discuss any questions you have with your health care provider. Document Revised: 05/12/2021 Document Reviewed: 05/12/2021 Elsevier Patient Education  2024 [employer].

## 2023-12-21 ENCOUNTER — Encounter: Payer: Self-pay | Admitting: Family Medicine

## 2023-12-21 NOTE — Progress Notes (Signed)
Potassium is up a little bit.  I think this is probably a lab error.  He can go back to the lab in office to have this redrawn.  His cholesterol and lipids are stable.  His A1c is borderline but stable compared to previous.  The rest of his labs are all at goal.  Do not need to make any other changes to his treatment plan at this time.  He should continue to work on diet and exercise and we can recheck in a year.

## 2023-12-25 ENCOUNTER — Other Ambulatory Visit: Payer: Self-pay | Admitting: *Deleted

## 2023-12-25 DIAGNOSIS — E875 Hyperkalemia: Secondary | ICD-10-CM

## 2023-12-25 NOTE — Telephone Encounter (Signed)
Patient called back in.  I have reviewed Dr. Rhys Martini response with patient.   Patient understood.  Patient states he wanted to wait a couple months before having Potassium redrawn.  I advised to have completed sooner.  Patient states he will call back at a later time to schedule stating he wanted to hold off some time.

## 2024-01-09 ENCOUNTER — Other Ambulatory Visit (INDEPENDENT_AMBULATORY_CARE_PROVIDER_SITE_OTHER): Payer: Medicare HMO

## 2024-01-09 DIAGNOSIS — E875 Hyperkalemia: Secondary | ICD-10-CM

## 2024-01-09 LAB — COMPREHENSIVE METABOLIC PANEL
ALT: 17 U/L (ref 0–53)
AST: 23 U/L (ref 0–37)
Albumin: 4.6 g/dL (ref 3.5–5.2)
Alkaline Phosphatase: 67 U/L (ref 39–117)
BUN: 11 mg/dL (ref 6–23)
CO2: 30 meq/L (ref 19–32)
Calcium: 9 mg/dL (ref 8.4–10.5)
Chloride: 102 meq/L (ref 96–112)
Creatinine, Ser: 0.78 mg/dL (ref 0.40–1.50)
GFR: 91.86 mL/min (ref >60.00)
Glucose, Bld: 114 mg/dL — ABNORMAL HIGH (ref 70–99)
Potassium: 4.5 meq/L (ref 3.5–5.1)
Sodium: 138 meq/L (ref 135–145)
Total Bilirubin: 1.1 mg/dL (ref 0.2–1.2)
Total Protein: 7.5 g/dL (ref 6.0–8.3)

## 2024-01-10 DIAGNOSIS — J301 Allergic rhinitis due to pollen: Secondary | ICD-10-CM | POA: Diagnosis not present

## 2024-01-10 DIAGNOSIS — J3089 Other allergic rhinitis: Secondary | ICD-10-CM | POA: Diagnosis not present

## 2024-01-10 DIAGNOSIS — J3081 Allergic rhinitis due to animal (cat) (dog) hair and dander: Secondary | ICD-10-CM | POA: Diagnosis not present

## 2024-01-11 NOTE — Progress Notes (Signed)
Potassium is back to normal.  Do not need to do any other testing at this point.  We can recheck labs at his next visit.  Katina Degree. Jimmey Ralph, MD 01/11/2024 9:00 AM

## 2024-01-16 DIAGNOSIS — J3089 Other allergic rhinitis: Secondary | ICD-10-CM | POA: Diagnosis not present

## 2024-01-16 DIAGNOSIS — J3081 Allergic rhinitis due to animal (cat) (dog) hair and dander: Secondary | ICD-10-CM | POA: Diagnosis not present

## 2024-01-16 DIAGNOSIS — J301 Allergic rhinitis due to pollen: Secondary | ICD-10-CM | POA: Diagnosis not present

## 2024-01-22 DIAGNOSIS — J3081 Allergic rhinitis due to animal (cat) (dog) hair and dander: Secondary | ICD-10-CM | POA: Diagnosis not present

## 2024-01-22 DIAGNOSIS — J3089 Other allergic rhinitis: Secondary | ICD-10-CM | POA: Diagnosis not present

## 2024-01-22 DIAGNOSIS — J301 Allergic rhinitis due to pollen: Secondary | ICD-10-CM | POA: Diagnosis not present

## 2024-01-29 DIAGNOSIS — J3089 Other allergic rhinitis: Secondary | ICD-10-CM | POA: Diagnosis not present

## 2024-01-29 DIAGNOSIS — J3081 Allergic rhinitis due to animal (cat) (dog) hair and dander: Secondary | ICD-10-CM | POA: Diagnosis not present

## 2024-01-29 DIAGNOSIS — J301 Allergic rhinitis due to pollen: Secondary | ICD-10-CM | POA: Diagnosis not present

## 2024-02-21 DIAGNOSIS — J3089 Other allergic rhinitis: Secondary | ICD-10-CM | POA: Diagnosis not present

## 2024-02-21 DIAGNOSIS — J3081 Allergic rhinitis due to animal (cat) (dog) hair and dander: Secondary | ICD-10-CM | POA: Diagnosis not present

## 2024-02-21 DIAGNOSIS — J301 Allergic rhinitis due to pollen: Secondary | ICD-10-CM | POA: Diagnosis not present

## 2024-03-21 DIAGNOSIS — J3081 Allergic rhinitis due to animal (cat) (dog) hair and dander: Secondary | ICD-10-CM | POA: Diagnosis not present

## 2024-03-21 DIAGNOSIS — J3089 Other allergic rhinitis: Secondary | ICD-10-CM | POA: Diagnosis not present

## 2024-03-21 DIAGNOSIS — J301 Allergic rhinitis due to pollen: Secondary | ICD-10-CM | POA: Diagnosis not present

## 2024-04-08 DIAGNOSIS — M545 Low back pain, unspecified: Secondary | ICD-10-CM | POA: Diagnosis not present

## 2024-04-15 DIAGNOSIS — J301 Allergic rhinitis due to pollen: Secondary | ICD-10-CM | POA: Diagnosis not present

## 2024-04-15 DIAGNOSIS — J3081 Allergic rhinitis due to animal (cat) (dog) hair and dander: Secondary | ICD-10-CM | POA: Diagnosis not present

## 2024-04-15 DIAGNOSIS — J3089 Other allergic rhinitis: Secondary | ICD-10-CM | POA: Diagnosis not present

## 2024-04-16 DIAGNOSIS — M25551 Pain in right hip: Secondary | ICD-10-CM | POA: Diagnosis not present

## 2024-04-18 ENCOUNTER — Encounter: Payer: Self-pay | Admitting: Internal Medicine

## 2024-04-24 DIAGNOSIS — M5431 Sciatica, right side: Secondary | ICD-10-CM | POA: Diagnosis not present

## 2024-04-24 DIAGNOSIS — S76011D Strain of muscle, fascia and tendon of right hip, subsequent encounter: Secondary | ICD-10-CM | POA: Diagnosis not present

## 2024-04-29 DIAGNOSIS — M5431 Sciatica, right side: Secondary | ICD-10-CM | POA: Diagnosis not present

## 2024-04-29 DIAGNOSIS — S76011D Strain of muscle, fascia and tendon of right hip, subsequent encounter: Secondary | ICD-10-CM | POA: Diagnosis not present

## 2024-05-01 DIAGNOSIS — S76011D Strain of muscle, fascia and tendon of right hip, subsequent encounter: Secondary | ICD-10-CM | POA: Diagnosis not present

## 2024-05-01 DIAGNOSIS — M5431 Sciatica, right side: Secondary | ICD-10-CM | POA: Diagnosis not present

## 2024-05-06 DIAGNOSIS — S76011D Strain of muscle, fascia and tendon of right hip, subsequent encounter: Secondary | ICD-10-CM | POA: Diagnosis not present

## 2024-05-06 DIAGNOSIS — M5431 Sciatica, right side: Secondary | ICD-10-CM | POA: Diagnosis not present

## 2024-05-08 DIAGNOSIS — J301 Allergic rhinitis due to pollen: Secondary | ICD-10-CM | POA: Diagnosis not present

## 2024-05-08 DIAGNOSIS — S76011D Strain of muscle, fascia and tendon of right hip, subsequent encounter: Secondary | ICD-10-CM | POA: Diagnosis not present

## 2024-05-08 DIAGNOSIS — J3081 Allergic rhinitis due to animal (cat) (dog) hair and dander: Secondary | ICD-10-CM | POA: Diagnosis not present

## 2024-05-08 DIAGNOSIS — J3089 Other allergic rhinitis: Secondary | ICD-10-CM | POA: Diagnosis not present

## 2024-05-08 DIAGNOSIS — M5431 Sciatica, right side: Secondary | ICD-10-CM | POA: Diagnosis not present

## 2024-05-13 DIAGNOSIS — M5431 Sciatica, right side: Secondary | ICD-10-CM | POA: Diagnosis not present

## 2024-05-13 DIAGNOSIS — S76011D Strain of muscle, fascia and tendon of right hip, subsequent encounter: Secondary | ICD-10-CM | POA: Diagnosis not present

## 2024-05-14 DIAGNOSIS — J301 Allergic rhinitis due to pollen: Secondary | ICD-10-CM | POA: Diagnosis not present

## 2024-05-14 DIAGNOSIS — J3081 Allergic rhinitis due to animal (cat) (dog) hair and dander: Secondary | ICD-10-CM | POA: Diagnosis not present

## 2024-05-14 DIAGNOSIS — J3089 Other allergic rhinitis: Secondary | ICD-10-CM | POA: Diagnosis not present

## 2024-05-15 DIAGNOSIS — S76011D Strain of muscle, fascia and tendon of right hip, subsequent encounter: Secondary | ICD-10-CM | POA: Diagnosis not present

## 2024-05-15 DIAGNOSIS — M5431 Sciatica, right side: Secondary | ICD-10-CM | POA: Diagnosis not present

## 2024-05-20 DIAGNOSIS — J3089 Other allergic rhinitis: Secondary | ICD-10-CM | POA: Diagnosis not present

## 2024-05-20 DIAGNOSIS — J3081 Allergic rhinitis due to animal (cat) (dog) hair and dander: Secondary | ICD-10-CM | POA: Diagnosis not present

## 2024-05-20 DIAGNOSIS — J301 Allergic rhinitis due to pollen: Secondary | ICD-10-CM | POA: Diagnosis not present

## 2024-05-29 DIAGNOSIS — J3089 Other allergic rhinitis: Secondary | ICD-10-CM | POA: Diagnosis not present

## 2024-05-29 DIAGNOSIS — J3081 Allergic rhinitis due to animal (cat) (dog) hair and dander: Secondary | ICD-10-CM | POA: Diagnosis not present

## 2024-05-29 DIAGNOSIS — J301 Allergic rhinitis due to pollen: Secondary | ICD-10-CM | POA: Diagnosis not present

## 2024-06-11 DIAGNOSIS — H43393 Other vitreous opacities, bilateral: Secondary | ICD-10-CM | POA: Diagnosis not present

## 2024-06-17 DIAGNOSIS — J3089 Other allergic rhinitis: Secondary | ICD-10-CM | POA: Diagnosis not present

## 2024-06-17 DIAGNOSIS — J3081 Allergic rhinitis due to animal (cat) (dog) hair and dander: Secondary | ICD-10-CM | POA: Diagnosis not present

## 2024-06-17 DIAGNOSIS — J301 Allergic rhinitis due to pollen: Secondary | ICD-10-CM | POA: Diagnosis not present

## 2024-06-21 ENCOUNTER — Telehealth: Payer: Self-pay | Admitting: Cardiology

## 2024-06-21 NOTE — Telephone Encounter (Signed)
 Spoke with [employer] regarding pt diagnosis codes for zio heart monitor. Pt's last EKG showed sinus bradycardia. Diagnosis code R00.1 for sinus bradycardia was given along with Z82.49 for family history of early CAD from pt's chart. [employer] verbalized understanding and all questions were answered.

## 2024-06-21 NOTE — Telephone Encounter (Signed)
 [employer] is requesting a callback wanting to verify the diagnosis [phone] and R931 due to them being informed by pt's insurance that these codes are not a billable code for a heart monitor. They'll need additional code for pt being prescribed a zio. Please advise.

## 2024-07-12 DIAGNOSIS — J301 Allergic rhinitis due to pollen: Secondary | ICD-10-CM | POA: Diagnosis not present

## 2024-07-12 DIAGNOSIS — J3081 Allergic rhinitis due to animal (cat) (dog) hair and dander: Secondary | ICD-10-CM | POA: Diagnosis not present

## 2024-07-12 DIAGNOSIS — J3089 Other allergic rhinitis: Secondary | ICD-10-CM | POA: Diagnosis not present

## 2024-08-02 DIAGNOSIS — I781 Nevus, non-neoplastic: Secondary | ICD-10-CM | POA: Diagnosis not present

## 2024-08-02 DIAGNOSIS — D2262 Melanocytic nevi of left upper limb, including shoulder: Secondary | ICD-10-CM | POA: Diagnosis not present

## 2024-08-02 DIAGNOSIS — D2372 Other benign neoplasm of skin of left lower limb, including hip: Secondary | ICD-10-CM | POA: Diagnosis not present

## 2024-08-02 DIAGNOSIS — D2371 Other benign neoplasm of skin of right lower limb, including hip: Secondary | ICD-10-CM | POA: Diagnosis not present

## 2024-08-02 DIAGNOSIS — D225 Melanocytic nevi of trunk: Secondary | ICD-10-CM | POA: Diagnosis not present

## 2024-08-02 DIAGNOSIS — L578 Other skin changes due to chronic exposure to nonionizing radiation: Secondary | ICD-10-CM | POA: Diagnosis not present

## 2024-08-02 DIAGNOSIS — L814 Other melanin hyperpigmentation: Secondary | ICD-10-CM | POA: Diagnosis not present

## 2024-08-02 DIAGNOSIS — J301 Allergic rhinitis due to pollen: Secondary | ICD-10-CM | POA: Diagnosis not present

## 2024-08-02 DIAGNOSIS — J3081 Allergic rhinitis due to animal (cat) (dog) hair and dander: Secondary | ICD-10-CM | POA: Diagnosis not present

## 2024-08-02 DIAGNOSIS — D2271 Melanocytic nevi of right lower limb, including hip: Secondary | ICD-10-CM | POA: Diagnosis not present

## 2024-08-02 DIAGNOSIS — L570 Actinic keratosis: Secondary | ICD-10-CM | POA: Diagnosis not present

## 2024-08-02 DIAGNOSIS — J3089 Other allergic rhinitis: Secondary | ICD-10-CM | POA: Diagnosis not present

## 2024-08-02 DIAGNOSIS — L821 Other seborrheic keratosis: Secondary | ICD-10-CM | POA: Diagnosis not present

## 2024-08-19 ENCOUNTER — Encounter: Payer: Self-pay | Admitting: Cardiology

## 2024-08-19 ENCOUNTER — Ambulatory Visit: Attending: Cardiology | Admitting: Cardiology

## 2024-08-19 VITALS — BP 108/60 | HR 54 | Ht 67.0 in | Wt 152.4 lb

## 2024-08-19 DIAGNOSIS — E785 Hyperlipidemia, unspecified: Secondary | ICD-10-CM

## 2024-08-19 DIAGNOSIS — R931 Abnormal findings on diagnostic imaging of heart and coronary circulation: Secondary | ICD-10-CM | POA: Diagnosis not present

## 2024-08-19 MED ORDER — ROSUVASTATIN CALCIUM 20 MG PO TABS: 20.0000 mg | ORAL_TABLET | Freq: Every day | ORAL | 3 refills | Status: AC

## 2024-08-19 NOTE — Progress Notes (Signed)
 Cardiology Office Note:  .   Date:  08/19/2024  ID:  Franklin [name], DOB December 20, 1955, MRN [id number] PCP: Kennyth Worth HERO, MD  [facility] Providers Cardiologist:  None     History of Present Illness: .   Franklin [name] is a 68 y.o. male Discussed the use of AI scribe software  History of Present Illness Franklin [name] is a 68 year old male with a high coronary calcium  score who presents for cardiovascular evaluation. He was referred by a friend for cardiovascular evaluation after his previous cardiologist departed (Dr. Ronal Ross).  He presents following a coronary calcium  score of 1243, which is in the 89th percentile for his age. He has a significant family history of heart disease, with his father having undergone a quadruple bypass at age 51 and his older brother having a stent placed at age 53. Despite this, neither family member passed away from heart disease.  No current symptoms such as chest pain or shortness of breath. He experienced an irregular heartbeat in the past, which prompted him to switch from caffeinated to decaffeinated coffee. He wore an iRhythm patch for a week, which showed minimal ectopy with PVCs and PACs, but no atrial fibrillation. Since switching to decaf, he has not experienced further irregular heartbeats.  His past workup includes a nuclear stress test in 2024, which was normal with no ischemia and an ejection fraction of 65%. He has been on simvastatin  20 mg since 2007 due to a high calcium  score at that time, maintaining consistent cholesterol levels with a total cholesterol of 150. His LDL was recorded at 70 in January 2025. He is also on a low-dose aspirin regimen.  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  He plays golf and pickleball regularly.    Studies Reviewed: SABRA   EKG Interpretation Date/Time:  Monday August 19 2024 08:55:02 EDT Ventricular Rate:  54 PR  Interval:  190 QRS Duration:  98 QT Interval:  396 QTC Calculation: 375 R Axis:   48  Text Interpretation: Sinus bradycardia When compared with ECG of 31-Oct-2023 09:37, No significant change was found Confirmed by Jeffrie Anes ([id number]) on 08/19/2024 9:19:59 AM    Results LABS LDL: 70 (12/19/2023) Lipoprotein(a): 135  RADIOLOGY Coronary calcium  score: 1243, 89th percentile for age (08/2023)  DIAGNOSTIC Event monitor: No atrial fibrillation, minimal ectopy, PVCs, PACs (10/2023) Nuclear stress test: Normal, no ischemia, EF 65% (2024) ECG: Normal Risk Assessment/Calculations:            Physical Exam:   VS:  BP 108/60 (BP Location: Left Arm, Patient Position: Sitting, Cuff Size: Normal)   Pulse (!) 54   Ht 5' 7 (1.702 m)   Wt 152 lb 6 oz (69.1 kg)   SpO2 99%   BMI 23.87 kg/m    Wt Readings from Last 3 Encounters:  08/19/24 152 lb 6 oz (69.1 kg)  12/19/23 157 lb 6.4 oz (71.4 kg)  11/06/23 155 lb (70.3 kg)    GEN: Well nourished, well developed in no acute distress NECK: No JVD; No carotid bruits CARDIAC: RRR, no murmurs, no rubs, no gallops RESPIRATORY:  Clear to auscultation without rales, wheezing or rhonchi  ABDOMEN: Soft, non-tender, non-distended EXTREMITIES:  No edema; No deformity   ASSESSMENT AND PLAN: .    Assessment and Plan Assessment & Plan Coronary atherosclerosis with markedly elevated coronary calcium  score and elevated lipoprotein(a) Coronary atherosclerosis with a coronary calcium  score  of 1243, placing him in the 89th percentile for his age, indicating a high risk of cardiovascular events. Elevated lipoprotein(a) at 135, a marker of increased cardiovascular risk. No current symptoms of ischemia, and previous nuclear stress test was normal. Family history of heart disease with father having a quadruple bypass and brother having a stent. Current management includes simvastatin  and low-dose aspirin. Discussion on the role of calcium  in stabilizing soft  plaque and the potential for soft plaque to cause heart attacks if inflamed. Consideration of switching to a more potent statin and adding colchicine to further reduce cardiovascular risk. Rosuvastatin  is preferred for its potency and fewer drug interactions. Colchicine may further reduce risk but may cause gastrointestinal upset. - Switch simvastatin  20 mg to rosuvastatin  (Crestor ) 20 mg. - Discussed potential addition of low-dose colchicine for further risk reduction.  Hyperlipidemia on statin therapy Hyperlipidemia managed with simvastatin  20 mg since 2007, maintaining LDL at 70 mg/dL. Goal is to reduce LDL further, ideally below 55 mg/dL, given the high coronary calcium  score and elevated lipoprotein(a). Discussion on the benefits of switching to rosuvastatin  for better LDL reduction and fewer drug interactions. - Switch simvastatin  20 mg to rosuvastatin  (Crestor ) 20 mg. - Monitor LDL levels with blood work in January.  Premature ventricular and atrial contractions, resolved Previously experienced irregular heartbeat attributed to premature ventricular and atrial contractions, likely exacerbated by caffeine intake. Symptoms resolved after switching to decaffeinated coffee. No further episodes reported.         Dispo: 1 yr APP  Signed, Oneil Parchment, MD

## 2024-08-19 NOTE — Patient Instructions (Signed)
 Medication Instructions:  Please  discontinue your Simvastatin  and start Crestor  (Rosuvastatin ) 20 mg daily. Continue all other medications as listed.  *If you need a refill on your cardiac medications before your next appointment, please call your pharmacy*  Follow-Up: At [facility], you and your health needs are our priority.  As part of our continuing mission to provide you with exceptional heart care, our providers are all part of one team.  This team includes your primary Cardiologist (physician) and Advanced Practice Providers or APPs (Physician Assistants and Nurse Practitioners) who all work together to provide you with the care you need, when you need it.  Your next appointment:   1 year(s)  Provider:   One of our Advanced Practice Providers (APPs): Morse Clause, PA-C  Lamarr Satterfield, NP Miriam Shams, NP  Olivia Pavy, PA-C Josefa Beauvais, NP  Leontine Salen, PA-C Orren Fabry, PA-C  [place], PA-C Ernest Dick, NP  Damien Braver, NP Jon Hails, PA-C  Waddell Donath, PA-C    Dayna Dunn, PA-C  Scott Weaver, PA-C Lum Louis, NP Katlyn West, NP Callie Goodrich, PA-C  Xika Zhao, NP Sheng Haley, PA-C    Kathleen Johnson, PA-C       We recommend signing up for the patient portal called MyChart.  Sign up information is provided on this After Visit Summary.  MyChart is used to connect with patients for Virtual Visits (Telemedicine).  Patients are able to view lab/test results, encounter notes, upcoming appointments, etc.  Non-urgent messages can be sent to your provider as well.   To learn more about what you can do with MyChart, go to [email or web address].

## 2024-08-26 DIAGNOSIS — J301 Allergic rhinitis due to pollen: Secondary | ICD-10-CM | POA: Diagnosis not present

## 2024-08-26 DIAGNOSIS — J3081 Allergic rhinitis due to animal (cat) (dog) hair and dander: Secondary | ICD-10-CM | POA: Diagnosis not present

## 2024-08-26 DIAGNOSIS — J3089 Other allergic rhinitis: Secondary | ICD-10-CM | POA: Diagnosis not present

## 2024-09-27 DIAGNOSIS — J301 Allergic rhinitis due to pollen: Secondary | ICD-10-CM | POA: Diagnosis not present

## 2024-09-27 DIAGNOSIS — J3089 Other allergic rhinitis: Secondary | ICD-10-CM | POA: Diagnosis not present

## 2024-09-27 DIAGNOSIS — J3081 Allergic rhinitis due to animal (cat) (dog) hair and dander: Secondary | ICD-10-CM | POA: Diagnosis not present

## 2024-10-04 DIAGNOSIS — J3089 Other allergic rhinitis: Secondary | ICD-10-CM | POA: Diagnosis not present

## 2024-10-04 DIAGNOSIS — J301 Allergic rhinitis due to pollen: Secondary | ICD-10-CM | POA: Diagnosis not present

## 2024-10-04 DIAGNOSIS — J3081 Allergic rhinitis due to animal (cat) (dog) hair and dander: Secondary | ICD-10-CM | POA: Diagnosis not present

## 2024-10-09 DIAGNOSIS — J3081 Allergic rhinitis due to animal (cat) (dog) hair and dander: Secondary | ICD-10-CM | POA: Diagnosis not present

## 2024-10-09 DIAGNOSIS — J301 Allergic rhinitis due to pollen: Secondary | ICD-10-CM | POA: Diagnosis not present

## 2024-10-09 DIAGNOSIS — J3089 Other allergic rhinitis: Secondary | ICD-10-CM | POA: Diagnosis not present

## 2024-10-30 DIAGNOSIS — J3089 Other allergic rhinitis: Secondary | ICD-10-CM | POA: Diagnosis not present

## 2024-10-30 DIAGNOSIS — J301 Allergic rhinitis due to pollen: Secondary | ICD-10-CM | POA: Diagnosis not present

## 2024-10-30 DIAGNOSIS — J3081 Allergic rhinitis due to animal (cat) (dog) hair and dander: Secondary | ICD-10-CM | POA: Diagnosis not present

## 2024-11-05 DIAGNOSIS — J3089 Other allergic rhinitis: Secondary | ICD-10-CM | POA: Diagnosis not present

## 2024-11-05 DIAGNOSIS — J301 Allergic rhinitis due to pollen: Secondary | ICD-10-CM | POA: Diagnosis not present

## 2024-11-05 DIAGNOSIS — J3081 Allergic rhinitis due to animal (cat) (dog) hair and dander: Secondary | ICD-10-CM | POA: Diagnosis not present

## 2024-11-11 DIAGNOSIS — J3089 Other allergic rhinitis: Secondary | ICD-10-CM | POA: Diagnosis not present

## 2024-11-11 DIAGNOSIS — J301 Allergic rhinitis due to pollen: Secondary | ICD-10-CM | POA: Diagnosis not present

## 2024-11-11 DIAGNOSIS — J3081 Allergic rhinitis due to animal (cat) (dog) hair and dander: Secondary | ICD-10-CM | POA: Diagnosis not present

## 2024-12-20 ENCOUNTER — Encounter: Payer: Self-pay | Admitting: Family Medicine

## 2024-12-20 ENCOUNTER — Ambulatory Visit: Payer: Medicare HMO | Admitting: Family Medicine

## 2024-12-20 VITALS — BP 110/70 | HR 48 | Temp 97.3°F | Ht 67.0 in | Wt 155.4 lb

## 2024-12-20 DIAGNOSIS — Z125 Encounter for screening for malignant neoplasm of prostate: Secondary | ICD-10-CM | POA: Diagnosis not present

## 2024-12-20 DIAGNOSIS — K4090 Unilateral inguinal hernia, without obstruction or gangrene, not specified as recurrent: Secondary | ICD-10-CM

## 2024-12-20 DIAGNOSIS — Z131 Encounter for screening for diabetes mellitus: Secondary | ICD-10-CM | POA: Diagnosis not present

## 2024-12-20 DIAGNOSIS — E785 Hyperlipidemia, unspecified: Secondary | ICD-10-CM

## 2024-12-20 DIAGNOSIS — Z0001 Encounter for general adult medical examination with abnormal findings: Secondary | ICD-10-CM

## 2024-12-20 LAB — COMPREHENSIVE METABOLIC PANEL WITH GFR
ALT: 22 U/L (ref 3–53)
AST: 26 U/L (ref 5–37)
Albumin: 4.8 g/dL (ref 3.5–5.2)
Alkaline Phosphatase: 57 U/L (ref 39–117)
BUN: 14 mg/dL (ref 6–23)
CO2: 30 meq/L (ref 19–32)
Calcium: 9.5 mg/dL (ref 8.4–10.5)
Chloride: 102 meq/L (ref 96–112)
Creatinine, Ser: 0.69 mg/dL (ref 0.40–1.50)
GFR: 94.69 mL/min
Glucose, Bld: 99 mg/dL (ref 70–99)
Potassium: 4.4 meq/L (ref 3.5–5.1)
Sodium: 140 meq/L (ref 135–145)
Total Bilirubin: 1.1 mg/dL (ref 0.2–1.2)
Total Protein: 7.3 g/dL (ref 6.0–8.3)

## 2024-12-20 LAB — PSA: PSA: 2.1 ng/mL (ref 0.10–4.00)

## 2024-12-20 LAB — CBC
HCT: 43.5 % (ref 39.0–52.0)
Hemoglobin: 15.1 g/dL (ref 13.0–17.0)
MCHC: 34.8 g/dL (ref 30.0–36.0)
MCV: 95.5 fl (ref 78.0–100.0)
Platelets: 178 K/uL (ref 150.0–400.0)
RBC: 4.56 Mil/uL (ref 4.22–5.81)
RDW: 12.5 % (ref 11.5–15.5)
WBC: 5.7 K/uL (ref 4.0–10.5)

## 2024-12-20 LAB — HEMOGLOBIN A1C: Hgb A1c MFr Bld: 5.8 % (ref 4.6–6.5)

## 2024-12-20 LAB — LIPID PANEL
Cholesterol: 119 mg/dL (ref 28–200)
HDL: 44.6 mg/dL
LDL Cholesterol: 53 mg/dL (ref 10–99)
NonHDL: 74.47
Total CHOL/HDL Ratio: 3
Triglycerides: 106 mg/dL (ref 10.0–149.0)
VLDL: 21.2 mg/dL (ref 0.0–40.0)

## 2024-12-20 LAB — TSH: TSH: 2.15 u[IU]/mL (ref 0.35–5.50)

## 2024-12-20 NOTE — Patient Instructions (Signed)
 It was very nice to see you today!  VISIT SUMMARY: Today, you had your annual physical exam. We discussed your cholesterol management, left inguinal hernia, and chronic piriformis syndrome. Blood work was ordered, and you should continue your current exercise regimen.  YOUR PLAN: ANNUAL PHYSICAL EXAMINATION: Routine annual physical examination revealed no acute concerns. -Blood work was ordered, including cholesterol, A1c, blood counts, kidney function, and PSA. -Continue your current exercise regimen of walking, golf, and pickleball, aiming for 150 minutes of cardio per week.  DYSLIPIDEMIA: Your cholesterol is being managed with rosuvastatin  due to a high calcium  score and family history of cardiovascular disease. -Continue taking rosuvastatin . -Follow up with a cardiologist in September to review lab results.  LEFT INGUINAL HERNIA: You experience an intermittent bulge and occasional pain from a left inguinal hernia. -Surgical intervention was discussed as the definitive treatment, with risks including incarceration or strangulation, which could necessitate emergency surgery. -You have been referred to a surgeon for consultation regarding hernia repair.  PIRIFORMIS SYNDROME: Chronic piriformis syndrome presents with residual symptoms. -Continue daily exercises and physical therapy to manage the condition.  Return in about 1 year (around 12/20/2025) for Annual Physical.   Take care, Dr Kennyth  PLEASE NOTE:  If you had any lab tests, please let us  know if you have not heard back within a few days. You may see your results on mychart before we have a chance to review them but we will give you a call once they are reviewed by us .   If we ordered any referrals today, please let us  know if you have not heard from their office within the next week.   If you had any urgent prescriptions sent in today, please check with the pharmacy within an hour of our visit to make sure the prescription was  transmitted appropriately.   Please try these tips to maintain a healthy lifestyle:  Eat at least 3 REAL meals and 1-2 snacks per day.  Aim for no more than 5 hours between eating.  If you eat breakfast, please do so within one hour of getting up.   Each meal should contain half fruits/vegetables, one quarter protein, and one quarter carbs (no bigger than a computer mouse)  Cut down on sweet beverages. This includes juice, soda, and sweet tea.   Drink at least 1 glass of water with each meal and aim for at least 8 glasses per day  Exercise at least 150 minutes every week.    Preventive Care 60 Years and Older, Male Preventive care refers to lifestyle choices and visits with your health care provider that can promote health and wellness. Preventive care visits are also called wellness exams. What can I expect for my preventive care visit? Counseling During your preventive care visit, your health care provider may ask about your: Medical history, including: Past medical problems. Family medical history. History of falls. Current health, including: Emotional well-being. Home life and relationship well-being. Sexual activity. Memory and ability to understand (cognition). Lifestyle, including: Alcohol, nicotine or tobacco, and drug use. Access to firearms. Diet, exercise, and sleep habits. Work and work astronomer. Sunscreen use. Safety issues such as seatbelt and bike helmet use. Physical exam Your health care provider will check your: Height and weight. These may be used to calculate your BMI (body mass index). BMI is a measurement that tells if you are at a healthy weight. Waist circumference. This measures the distance around your waistline. This measurement also tells if you are at a  healthy weight and may help predict your risk of certain diseases, such as type 2 diabetes and high blood pressure. Heart rate and blood pressure. Body temperature. Skin for abnormal  spots. What immunizations do I need?  Vaccines are usually given at various ages, according to a schedule. Your health care provider will recommend vaccines for you based on your age, medical history, and lifestyle or other factors, such as travel or where you work. What tests do I need? Screening Your health care provider may recommend screening tests for certain conditions. This may include: Lipid and cholesterol levels. Diabetes screening. This is done by checking your blood sugar (glucose) after you have not eaten for a while (fasting). Hepatitis C test. Hepatitis B test. HIV (human immunodeficiency virus) test. STI (sexually transmitted infection) testing, if you are at risk. Lung cancer screening. Colorectal cancer screening. Prostate cancer screening. Abdominal aortic aneurysm (AAA) screening. You may need this if you are a current or former smoker. Talk with your health care provider about your test results, treatment options, and if necessary, the need for more tests. Follow these instructions at home: Eating and drinking  Eat a diet that includes fresh fruits and vegetables, whole grains, lean protein, and low-fat dairy products. Limit your intake of foods with high amounts of sugar, saturated fats, and salt. Take vitamin and mineral supplements as recommended by your health care provider. Do not drink alcohol if your health care provider tells you not to drink. If you drink alcohol: Limit how much you have to 0-2 drinks a day. Know how much alcohol is in your drink. In the [place] one drink equals one 12 oz bottle of beer (355 mL), one 5 oz glass of wine (148 mL), or one 1 oz glass of hard liquor (44 mL). Lifestyle Brush your teeth every morning and night with fluoride toothpaste. Floss one time each day. Exercise for at least 30 minutes 5 or more days each week. Do not use any products that contain nicotine or tobacco. These products include cigarettes, chewing tobacco, and  vaping devices, such as e-cigarettes. If you need help quitting, ask your health care provider. Do not use drugs. If you are sexually active, practice safe sex. Use a condom or other form of protection to prevent STIs. Take aspirin only as told by your health care provider. Make sure that you understand how much to take and what form to take. Work with your health care provider to find out whether it is safe and beneficial for you to take aspirin daily. Ask your health care provider if you need to take a cholesterol-lowering medicine (statin). Find healthy ways to manage stress, such as: Meditation, yoga, or listening to music. Journaling. Talking to a trusted person. Spending time with friends and family. Safety Always wear your seat belt while driving or riding in a vehicle. Do not drive: If you have been drinking alcohol. Do not ride with someone who has been drinking. When you are tired or distracted. While texting. If you have been using any mind-altering substances or drugs. Wear a helmet and other protective equipment during sports activities. If you have firearms in your house, make sure you follow all gun safety procedures. Minimize exposure to UV radiation to reduce your risk of skin cancer. What's next? Visit your health care provider once a year for an annual wellness visit. Ask your health care provider how often you should have your eyes and teeth checked. Stay up to date on all vaccines. This  information is not intended to replace advice given to you by your health care provider. Make sure you discuss any questions you have with your health care provider. Document Revised: 05/12/2021 Document Reviewed: 05/12/2021 Elsevier Patient Education  2024 [employer].

## 2024-12-20 NOTE — Assessment & Plan Note (Signed)
 Check lipids.  Now on Crestor  20 mg daily per cardiology.  Doing well with this.  He will let us  know if he would like for us  to take over management for this.

## 2024-12-20 NOTE — Progress Notes (Signed)
 "  Chief Complaint:  Franklin [name] is a 69 y.o. male who presents today for his annual comprehensive physical exam.    Assessment/Plan:  New/Acute Problems: Left Inguinal Hernia  No red flags.  Having occasional pain.  Will place referral to surgery for consultation to discuss surgical management.  We discussed reasons to return to care and seek emergent care.  Former syndrome Following with orthopedics for this.  Working on daily exercises.  Symptoms are improving but is still persistent.  Chronic Problems Addressed Today: Dyslipidemia Check lipids.  Now on Crestor  20 mg daily per cardiology.  Doing well with this.  He will let us  know if he would like for us  to take over management for this.  Preventative Healthcare: Check labs.  He has deferred pneumonia vaccine until 69 years old.  Up-to-date on colon cancer screening.  Patient Counseling(The following topics were reviewed and/or handout was given):  -Nutrition: Stressed importance of moderation in sodium/caffeine intake, saturated fat and cholesterol, caloric balance, sufficient intake of fresh fruits, vegetables, and fiber.  -Stressed the importance of regular exercise.   -Substance Abuse: Discussed cessation/primary prevention of tobacco, alcohol, or other drug use; driving or other dangerous activities under the influence; availability of treatment for abuse.   -Injury prevention: Discussed safety belts, safety helmets, smoke detector, smoking near bedding or upholstery.   -Sexuality: Discussed sexually transmitted diseases, partner selection, use of condoms, avoidance of unintended pregnancy and contraceptive alternatives.   -Dental health: Discussed importance of regular tooth brushing, flossing, and dental visits.  -Health maintenance and immunizations reviewed. Please refer to Health maintenance section.  Return to care in 1 year for next preventative visit.     Subjective:  HPI:  He has no acute complaints today.  Patient is here today for his annual physical.  See assessment / plan for status of chronic conditions.  Discussed the use of AI scribe software for clinical note transcription with the patient, who gave verbal consent to proceed.  History of Present Illness Franklin [name] is a 69 year old male who presents for an annual physical exam.  He recently switched his cholesterol medication from simvastatin  to rosuvastatin . He has a history of a high calcium  score. He has a family history of significant cardiac events, including his father and brothers having heart attacks, all involving the right coronary artery.  He maintains an active lifestyle, playing golf and pickleball four times a week, walking daily for 25 minutes, and doing exercises at home.  He has a history of piriformis syndrome, which was aggravated in May of last year, causing sciatic nerve pain that affected his sleep. He underwent six weeks of physical therapy and continues daily exercises to manage the condition, which remains chronic but manageable.  He reports a left-sided hernia, feeling occasional pain and a bulge, especially when there are 'bubbles in the intestine.' He had a right-sided hernia repair at age [age] and an umbilical hernia repair in 2019.  He has received his flu shot and plans to get the pneumonia vaccine next year for his 69th birthday. He is up to date with his colonoscopy, which is due in a few years.      12/20/2024    9:01 AM  Depression screen PHQ 2/9  Decreased Interest 0  Down, Depressed, Hopeless 0  PHQ - 2 Score 0    Health Maintenance Due  Topic Date Due   DTaP/Tdap/Td (1 - Tdap) Never done   Pneumococcal Vaccine: 50+ Years (1 of 1 -  PCV) Never done   Medicare Annual Wellness (AWV)  03/08/2023     ROS: Per HPI, otherwise a complete review of systems was negative.   PMH:  The following were reviewed and entered/updated in epic: Past Medical History:  Diagnosis Date   GERD  (gastroesophageal reflux disease)    Hyperlipidemia    Patient Active Problem List   Diagnosis Date Noted   Constipation 12/19/2023   Elevated coronary artery calcium  score 12/19/2023   Inguinal hernia 12/14/2022   Recurrent sinusitis 01/06/2022   Seasonal allergies 05/21/2018   Dyslipidemia 05/21/2018   Family history of early CAD 05/24/2016   Past Surgical History:  Procedure Laterality Date   APPENDECTOMY     CATARACT     SINUS POLLYPS     TONSILLOCTOMY     UMBILICAL HERNIA REPAIR N/A 11/09/2018   Procedure: HERNIA REPAIR UMBILICAL WITH POSSIBLE MESH ERAS PATHWAY;  Surgeon: Kimble Agent, MD;  Location: [facility];  Service: General;  Laterality: N/A;    Family History  Problem Relation Age of Onset   Heart disease Mother    Heart disease Father    Stroke Brother    Heart disease Brother    Hypertension Brother    Colon cancer Neg Hx    Rectal cancer Neg Hx    Stomach cancer Neg Hx    Esophageal cancer Neg Hx     Medications- reviewed and updated Current Outpatient Medications  Medication Sig Dispense Refill   BABY ASPIRIN PO Take 160 mg by mouth daily. (Patient taking differently: Take 81 mg by mouth daily.)     Coenzyme Q10-Fish Oil-Vit E (CO-Q 10 OMEGA-3 FISH OIL PO) Take 2,000 Units by mouth daily.     EPINEPHrine 0.3 mg/0.3 mL IJ SOAJ injection      rosuvastatin  (CRESTOR ) 20 MG tablet Take 1 tablet (20 mg total) by mouth daily. 90 tablet 3   Triamcinolone Acetonide (NASACORT ALLERGY 24HR NA) Place into the nose as needed.     No current facility-administered medications for this visit.   Facility-Administered Medications Ordered in Other Visits  Medication Dose Route Frequency Provider Last Rate Last Admin   regadenoson  (LEXISCAN ) injection SOLN 0.4 mg  0.4 mg Intravenous Once Lonni Slain, MD       technetium tetrofosmin  (TC-MYOVIEW ) injection 32.1 millicurie  32.1 millicurie Intravenous Once PRN Lonni Slain, MD         Allergies-reviewed and updated Allergies[1]  Social History   Socioeconomic History   Marital status: Married    Spouse name: Not on file   Number of children: Not on file   Years of education: Not on file   Highest education level: Bachelor's degree (e.g., BA, AB, BS)  Occupational History   Not on file  Tobacco Use   Smoking status: Never   Smokeless tobacco: Never  Vaping Use   Vaping status: Never Used  Substance and Sexual Activity   Alcohol use: Yes    Comment: OCCASIONAL   Drug use: Never   Sexual activity: Yes  Other Topics Concern   Not on file  Social History Narrative   Not on file   Social Drivers of Health   Tobacco Use: Low Risk (12/20/2024)   Patient History    Smoking Tobacco Use: Never    Smokeless Tobacco Use: Never    Passive Exposure: Not on file  Financial Resource Strain: Low Risk (12/16/2024)   Overall Financial Resource Strain (CARDIA)    Difficulty of Paying Living Expenses: Not  hard at all  Food Insecurity: No Food Insecurity (12/16/2024)   Epic    Worried About Programme Researcher, Broadcasting/film/video in the Last Year: Never true    Ran Out of Food in the Last Year: Never true  Transportation Needs: No Transportation Needs (12/16/2024)   Epic    Lack of Transportation (Medical): No    Lack of Transportation (Non-Medical): No  Physical Activity: Insufficiently Active (12/16/2024)   Exercise Vital Sign    Days of Exercise per Week: 3 days    Minutes of Exercise per Session: 40 min  Stress: No Stress Concern Present (12/16/2024)   [employer] of Occupational Health - Occupational Stress Questionnaire    Feeling of Stress: Not at all  Social Connections: Socially Integrated (12/16/2024)   Social Connection and Isolation Panel    Frequency of Communication with Friends and Family: More than three times a week    Frequency of Social Gatherings with Friends and Family: Three times a week    Attends Religious Services: More than 4 times per year    Active  Member of Clubs or Organizations: Yes    Attends Banker Meetings: 1 to 4 times per year    Marital Status: Married  Depression (PHQ2-9): Low Risk (12/20/2024)   Depression (PHQ2-9)    PHQ-2 Score: 0  Alcohol Screen: Low Risk (12/16/2024)   Alcohol Screen    Last Alcohol Screening Score (AUDIT): 4  Housing: Low Risk (12/16/2024)   Epic    Unable to Pay for Housing in the Last Year: No    Number of Times Moved in the Last Year: 0    Homeless in the Last Year: No  Utilities: Not on file  Health Literacy: Not on file        Objective:  Physical Exam: BP 110/70   Pulse (!) 48   Temp (!) 97.3 F (36.3 C) (Temporal)   Ht 5' 7 (1.702 m)   Wt 155 lb 6.4 oz (70.5 kg)   SpO2 97%   BMI 24.34 kg/m   Body mass index is 24.34 kg/m. Wt Readings from Last 3 Encounters:  12/20/24 155 lb 6.4 oz (70.5 kg)  08/19/24 152 lb 6 oz (69.1 kg)  12/19/23 157 lb 6.4 oz (71.4 kg)   Gen: NAD, resting comfortably HEENT: TMs normal bilaterally. OP clear. No thyromegaly noted.  CV: RRR with no murmurs appreciated Pulm: NWOB, CTAB with no crackles, wheezes, or rhonchi GI: Normal bowel sounds present. Soft, Nontender, Nondistended. MSK: no edema, cyanosis, or clubbing noted GU: Normal male genitalia.  Palpable bulge noted in the left inguinal area with Valsalva. Skin: warm, dry Neuro: CN2-12 grossly intact. Strength 5/5 in upper and lower extremities. Reflexes symmetric and intact bilaterally.  Psych: Normal affect and thought content     Shannell Mikkelsen M. Kennyth, MD 12/20/2024 9:40 AM     [1] No Known Allergies  "

## 2024-12-24 ENCOUNTER — Ambulatory Visit: Payer: Self-pay | Admitting: Family Medicine

## 2024-12-24 NOTE — Progress Notes (Signed)
 Great news! Labs are all stable. Do not need to make any changes to treatment plan at this time. He should keep up the great work with diet and exercise and we can recheck everything in a year or so.

## 2025-12-24 ENCOUNTER — Encounter: Admitting: Family Medicine
